# Patient Record
Sex: Female | Born: 1980 | Race: White | Hispanic: No | Marital: Married | Smoking: Never smoker
Health system: Southern US, Community
[De-identification: ages and names within clinical notes are randomized; demographics above are authoritative.]

## PROBLEM LIST (undated history)

## (undated) DIAGNOSIS — I10 Essential (primary) hypertension: Secondary | ICD-10-CM

## (undated) DIAGNOSIS — R011 Cardiac murmur, unspecified: Secondary | ICD-10-CM

---

## 2011-09-29 HISTORY — PX: CHOLECYSTECTOMY: SHX55

## 2013-04-28 DIAGNOSIS — I340 Nonrheumatic mitral (valve) insufficiency: Secondary | ICD-10-CM | POA: Insufficient documentation

## 2013-06-09 DIAGNOSIS — I10 Essential (primary) hypertension: Secondary | ICD-10-CM | POA: Insufficient documentation

## 2015-02-27 ENCOUNTER — Inpatient Hospital Stay (HOSPITAL_COMMUNITY): Admit: 2015-02-27 | Payer: Self-pay | Admitting: Obstetrics and Gynecology

## 2015-07-12 NOTE — H&P (Addendum)
Dawn Roth is a 34 y.o. female presenting for primary cesarean section due to term and breech presentation.  Pregnancy complicated by [facility] with normal anatomic and serial growth [place]. GBS-. History OB History    No data available     No past medical history on file. No past surgical history on file. Family History: family history is not on file. Social History:  has no tobacco, alcohol, and drug history on file.   Prenatal Transfer Tool  Maternal Diabetes: No Genetic Screening: Normal Maternal Ultrasounds/Referrals: Normal Fetal Ultrasounds or other Referrals:  None 2VC Maternal Substance Abuse:  No Significant Maternal Medications:  None Significant Maternal Lab Results:  None Other Comments:  None  ROS    There were no vitals taken for this visit. Exam Physical Exam   Cx Cl/75/-3 Breech Prenatal labs: ABO, Rh:   Antibody:   Rubella:   RPR:    HBsAg:    HIV:    GBS:     Assessment/Plan: IUP at term Breech presentation.  Plan LSTCS Risks and benefits of C/S were discussed.  All questions were answered and informed consent was obtained.  Plan to proceed with low segment transverse Cesarean Section.   Kimsey Demaree C 07/12/2015, 1:57 PM    This patient has been seen and examined.   All of her questions were answered.  Labs and vital signs reviewed.  Informed consent has been obtained.  The History and Physical is current. Bedside US confirms breech 07/17/15 1230 DL

## 2015-07-15 ENCOUNTER — Inpatient Hospital Stay (HOSPITAL_COMMUNITY): Payer: PRIVATE HEALTH INSURANCE

## 2015-07-15 ENCOUNTER — Encounter (HOSPITAL_COMMUNITY)
Admission: RE | Admit: 2015-07-15 | Discharge: 2015-07-15 | Disposition: A | Discharge status: Home / Self Care | Payer: PRIVATE HEALTH INSURANCE | Source: Ambulatory Visit | Attending: Obstetrics and Gynecology | Admitting: Obstetrics and Gynecology

## 2015-07-15 ENCOUNTER — Encounter (HOSPITAL_COMMUNITY): Payer: Self-pay

## 2015-07-15 HISTORY — DX: Cardiac murmur, unspecified: R01.1

## 2015-07-15 LAB — CBC
HCT: 35.6 % — ABNORMAL LOW (ref 36.0–46.0)
Hemoglobin: 11.7 g/dL — ABNORMAL LOW (ref 12.0–15.0)
MCH: 29.4 pg (ref 26.0–34.0)
MCHC: 32.9 g/dL (ref 30.0–36.0)
MCV: 89.4 fL (ref 78.0–100.0)
PLATELETS: 210 10*3/uL (ref 150–400)
RBC: 3.98 MIL/uL (ref 3.87–5.11)
RDW: 14.1 % (ref 11.5–15.5)
WBC: 9.8 10*3/uL (ref 4.0–10.5)

## 2015-07-15 LAB — TYPE AND SCREEN
ABO/RH(D): O POS
Antibody Screen: NEGATIVE

## 2015-07-15 LAB — ABO/RH: ABO/RH(D): O POS

## 2015-07-15 LAB — RPR: RPR Ser Ql: NONREACTIVE

## 2015-07-15 NOTE — Patient Instructions (Addendum)
   Your procedure is scheduled on: OCT 19 AT 1PM  Enter through the Main Entrance of [facility] at: [place] up the phone at the desk and dial [phone] and inform us of your arrival.  Please call this number if you have any problems the morning of surgery: [phone]  Remember: Do not eat food after midnight: OCT 18 (TUESDAY) Do not drink clear liquids after: 9AM DAY OF SURGERY [facility])  Take these medicines the morning of surgery with a SIP OF WATER: TAKE LABETALOL DAY OF SURGERY   Do not wear jewelry,  No metal in your hair or on your body. Do not wear lotions, powders, perfumes.  You may wear deodorant.  Do not bring valuables to the hospital.   Leave suitcase in the car. After Surgery it may be brought to your room. For patients being admitted to the hospital, checkout time is 11:00am the day of discharge.

## 2015-07-16 MED ORDER — DEXTROSE 5 % IV SOLN
2.0000 g | INTRAVENOUS | Status: DC
  Filled 2015-07-16: qty 2

## 2015-07-17 ENCOUNTER — Inpatient Hospital Stay (HOSPITAL_COMMUNITY)
Admission: AD | Admit: 2015-07-17 | Discharge: 2015-07-19 | DRG: 766 | Disposition: A | Discharge status: Home / Self Care | Payer: PRIVATE HEALTH INSURANCE | Source: Ambulatory Visit | Attending: Obstetrics and Gynecology | Admitting: Obstetrics and Gynecology

## 2015-07-17 ENCOUNTER — Encounter (HOSPITAL_COMMUNITY): Payer: Self-pay | Admitting: *Deleted

## 2015-07-17 ENCOUNTER — Inpatient Hospital Stay (HOSPITAL_COMMUNITY): Payer: PRIVATE HEALTH INSURANCE | Admitting: Anesthesiology

## 2015-07-17 ENCOUNTER — Encounter (HOSPITAL_COMMUNITY)
Admission: AD | Disposition: A | Discharge status: Home / Self Care | Payer: Self-pay | Source: Ambulatory Visit | Attending: Obstetrics and Gynecology

## 2015-07-17 DIAGNOSIS — Z3A39 39 weeks gestation of pregnancy: Secondary | ICD-10-CM | POA: Diagnosis not present

## 2015-07-17 DIAGNOSIS — O321XX0 Maternal care for breech presentation, not applicable or unspecified: Secondary | ICD-10-CM | POA: Diagnosis present

## 2015-07-17 DIAGNOSIS — Z3403 Encounter for supervision of normal first pregnancy, third trimester: Secondary | ICD-10-CM | POA: Diagnosis present

## 2015-07-17 SURGERY — Surgical Case
Anesthesia: Spinal

## 2015-07-17 MED ORDER — SENNOSIDES-DOCUSATE SODIUM 8.6-50 MG PO TABS
2.0000 | ORAL_TABLET | ORAL | Status: DC
  Administered 2015-07-18 (×2): 2 via ORAL
  Filled 2015-07-17 (×2): qty 2

## 2015-07-17 MED ORDER — KETOROLAC TROMETHAMINE 30 MG/ML IJ SOLN: 30.0000 mg | Freq: Four times a day (QID) | INTRAMUSCULAR | Status: AC | PRN

## 2015-07-17 MED ORDER — SIMETHICONE 80 MG PO CHEW
80.0000 mg | CHEWABLE_TABLET | ORAL | Status: DC | PRN
  Administered 2015-07-18: 80 mg via ORAL

## 2015-07-17 MED ORDER — OXYCODONE-ACETAMINOPHEN 5-325 MG PO TABS
2.0000 | ORAL_TABLET | ORAL | Status: DC | PRN
  Filled 2015-07-17: qty 2

## 2015-07-17 MED ORDER — ONDANSETRON HCL 4 MG/2ML IJ SOLN
INTRAMUSCULAR | Status: AC
  Filled 2015-07-17: qty 2

## 2015-07-17 MED ORDER — NALOXONE HCL 2 MG/2ML IJ SOSY: 1.0000 ug/kg/h | PREFILLED_SYRINGE | INTRAVENOUS | Status: DC | PRN

## 2015-07-17 MED ORDER — TETANUS-DIPHTH-ACELL PERTUSSIS 5-2.5-18.5 LF-MCG/0.5 IM SUSP: 0.5000 mL | Freq: Once | INTRAMUSCULAR | Status: DC

## 2015-07-17 MED ORDER — FENTANYL CITRATE (PF) 100 MCG/2ML IJ SOLN
INTRAMUSCULAR | Status: DC | PRN
  Administered 2015-07-17: 10 ug via INTRATHECAL

## 2015-07-17 MED ORDER — NALBUPHINE HCL 10 MG/ML IJ SOLN: 5.0000 mg | INTRAMUSCULAR | Status: DC | PRN

## 2015-07-17 MED ORDER — MENTHOL 3 MG MT LOZG: 1.0000 | LOZENGE | OROMUCOSAL | Status: DC | PRN

## 2015-07-17 MED ORDER — WITCH HAZEL-GLYCERIN EX PADS: 1.0000 "application " | MEDICATED_PAD | CUTANEOUS | Status: DC | PRN

## 2015-07-17 MED ORDER — PHENYLEPHRINE 8 MG IN D5W 100 ML (0.08MG/ML) PREMIX OPTIME
INJECTION | INTRAVENOUS | Status: DC | PRN
  Administered 2015-07-17: 60 ug/min via INTRAVENOUS

## 2015-07-17 MED ORDER — DIPHENHYDRAMINE HCL 50 MG/ML IJ SOLN: 12.5000 mg | INTRAMUSCULAR | Status: DC | PRN

## 2015-07-17 MED ORDER — DIPHENHYDRAMINE HCL 25 MG PO CAPS: 25.0000 mg | ORAL_CAPSULE | ORAL | Status: DC | PRN

## 2015-07-17 MED ORDER — IBUPROFEN 600 MG PO TABS
600.0000 mg | ORAL_TABLET | Freq: Four times a day (QID) | ORAL | Status: DC
  Administered 2015-07-18 – 2015-07-19 (×7): 600 mg via ORAL
  Filled 2015-07-17 (×7): qty 1

## 2015-07-17 MED ORDER — NALOXONE HCL 1 MG/ML IJ SOLN: 1.0000 ug/kg/h | INTRAMUSCULAR | Status: DC | PRN

## 2015-07-17 MED ORDER — NALOXONE HCL 0.4 MG/ML IJ SOLN: 0.4000 mg | INTRAMUSCULAR | Status: DC | PRN

## 2015-07-17 MED ORDER — KETOROLAC TROMETHAMINE 30 MG/ML IJ SOLN
30.0000 mg | Freq: Four times a day (QID) | INTRAMUSCULAR | Status: AC | PRN
  Administered 2015-07-17: 30 mg via INTRAMUSCULAR

## 2015-07-17 MED ORDER — CEFAZOLIN SODIUM-DEXTROSE 2-3 GM-% IV SOLR
INTRAVENOUS | Status: AC
  Administered 2015-07-17: 2 g via INTRAVENOUS
  Filled 2015-07-17: qty 50

## 2015-07-17 MED ORDER — OXYCODONE-ACETAMINOPHEN 5-325 MG PO TABS
1.0000 | ORAL_TABLET | ORAL | Status: DC | PRN
  Administered 2015-07-19: 1 via ORAL
  Filled 2015-07-17: qty 1

## 2015-07-17 MED ORDER — MORPHINE SULFATE (PF) 0.5 MG/ML IJ SOLN
INTRAMUSCULAR | Status: AC
  Filled 2015-07-17: qty 10

## 2015-07-17 MED ORDER — MEPERIDINE HCL 25 MG/ML IJ SOLN: 6.2500 mg | INTRAMUSCULAR | Status: DC | PRN

## 2015-07-17 MED ORDER — ACETAMINOPHEN 500 MG PO TABS
1000.0000 mg | ORAL_TABLET | Freq: Four times a day (QID) | ORAL | Status: AC
  Administered 2015-07-18: 1000 mg via ORAL
  Filled 2015-07-17: qty 2

## 2015-07-17 MED ORDER — SCOPOLAMINE 1 MG/3DAYS TD PT72
MEDICATED_PATCH | TRANSDERMAL | Status: AC
  Administered 2015-07-17: 1.5 mg via TRANSDERMAL
  Filled 2015-07-17: qty 1

## 2015-07-17 MED ORDER — LACTATED RINGERS IV SOLN
INTRAVENOUS | Status: DC
  Administered 2015-07-17: 13:00:00 via INTRAVENOUS

## 2015-07-17 MED ORDER — DIBUCAINE 1 % RE OINT: 1.0000 "application " | TOPICAL_OINTMENT | RECTAL | Status: DC | PRN

## 2015-07-17 MED ORDER — OXYTOCIN 10 UNIT/ML IJ SOLN
INTRAMUSCULAR | Status: AC
  Filled 2015-07-17: qty 4

## 2015-07-17 MED ORDER — LACTATED RINGERS IV SOLN
INTRAVENOUS | Status: DC
  Administered 2015-07-17: 125 mL/h via INTRAVENOUS

## 2015-07-17 MED ORDER — FENTANYL CITRATE (PF) 100 MCG/2ML IJ SOLN: 25.0000 ug | INTRAMUSCULAR | Status: DC | PRN

## 2015-07-17 MED ORDER — LANOLIN HYDROUS EX OINT: 1.0000 "application " | TOPICAL_OINTMENT | CUTANEOUS | Status: DC | PRN

## 2015-07-17 MED ORDER — OXYTOCIN 40 UNITS IN LACTATED RINGERS INFUSION - SIMPLE MED: 62.5000 mL/h | INTRAVENOUS | Status: AC

## 2015-07-17 MED ORDER — NALBUPHINE HCL 10 MG/ML IJ SOLN: 5.0000 mg | Freq: Once | INTRAMUSCULAR | Status: DC | PRN

## 2015-07-17 MED ORDER — OXYTOCIN 10 UNIT/ML IJ SOLN
40.0000 [IU] | INTRAVENOUS | Status: DC | PRN
  Administered 2015-07-17: 40 [IU] via INTRAVENOUS

## 2015-07-17 MED ORDER — PROMETHAZINE HCL 25 MG/ML IJ SOLN
6.2500 mg | INTRAMUSCULAR | Status: DC | PRN
Start: 2015-07-17 — End: 2015-07-17

## 2015-07-17 MED ORDER — SIMETHICONE 80 MG PO CHEW
80.0000 mg | CHEWABLE_TABLET | ORAL | Status: DC
  Administered 2015-07-18 (×2): 80 mg via ORAL
  Filled 2015-07-17 (×2): qty 1

## 2015-07-17 MED ORDER — LACTATED RINGERS IV SOLN
INTRAVENOUS | Status: DC | PRN
  Administered 2015-07-17: 14:00:00 via INTRAVENOUS

## 2015-07-17 MED ORDER — SCOPOLAMINE 1 MG/3DAYS TD PT72: 1.0000 | MEDICATED_PATCH | Freq: Once | TRANSDERMAL | Status: DC

## 2015-07-17 MED ORDER — SODIUM CHLORIDE 0.9 % IR SOLN
Status: DC | PRN
  Administered 2015-07-17: 1000 mL

## 2015-07-17 MED ORDER — KETOROLAC TROMETHAMINE 30 MG/ML IJ SOLN
INTRAMUSCULAR | Status: AC
  Administered 2015-07-17: 30 mg via INTRAMUSCULAR
  Filled 2015-07-17: qty 1

## 2015-07-17 MED ORDER — SODIUM CHLORIDE 0.9 % IJ SOLN: 3.0000 mL | INTRAMUSCULAR | Status: DC | PRN

## 2015-07-17 MED ORDER — ZOLPIDEM TARTRATE 5 MG PO TABS: 5.0000 mg | ORAL_TABLET | Freq: Every evening | ORAL | Status: DC | PRN

## 2015-07-17 MED ORDER — LACTATED RINGERS IV SOLN: INTRAVENOUS | Status: DC

## 2015-07-17 MED ORDER — ONDANSETRON HCL 4 MG/2ML IJ SOLN: 4.0000 mg | Freq: Three times a day (TID) | INTRAMUSCULAR | Status: DC | PRN

## 2015-07-17 MED ORDER — SIMETHICONE 80 MG PO CHEW
80.0000 mg | CHEWABLE_TABLET | Freq: Three times a day (TID) | ORAL | Status: DC
  Administered 2015-07-18 – 2015-07-19 (×3): 80 mg via ORAL
  Filled 2015-07-17 (×4): qty 1

## 2015-07-17 MED ORDER — MORPHINE SULFATE (PF) 0.5 MG/ML IJ SOLN
INTRAMUSCULAR | Status: DC | PRN
  Administered 2015-07-17: .2 mg via INTRATHECAL

## 2015-07-17 MED ORDER — ACETAMINOPHEN 325 MG PO TABS: 650.0000 mg | ORAL_TABLET | ORAL | Status: DC | PRN

## 2015-07-17 MED ORDER — DIPHENHYDRAMINE HCL 25 MG PO CAPS: 25.0000 mg | ORAL_CAPSULE | Freq: Four times a day (QID) | ORAL | Status: DC | PRN

## 2015-07-17 MED ORDER — LABETALOL HCL 100 MG PO TABS
100.0000 mg | ORAL_TABLET | Freq: Two times a day (BID) | ORAL | Status: DC
  Administered 2015-07-17 – 2015-07-19 (×4): 100 mg via ORAL
  Filled 2015-07-17 (×4): qty 1

## 2015-07-17 MED ORDER — PRENATAL MULTIVITAMIN CH
1.0000 | ORAL_TABLET | Freq: Every day | ORAL | Status: DC
  Administered 2015-07-18 – 2015-07-19 (×2): 1 via ORAL
  Filled 2015-07-17 (×2): qty 1

## 2015-07-17 MED ORDER — HYDROMORPHONE HCL 1 MG/ML IJ SOLN: 0.2500 mg | INTRAMUSCULAR | Status: DC | PRN

## 2015-07-17 MED ORDER — FENTANYL CITRATE (PF) 100 MCG/2ML IJ SOLN
INTRAMUSCULAR | Status: AC
  Filled 2015-07-17: qty 4

## 2015-07-17 MED ORDER — PHENYLEPHRINE 8 MG IN D5W 100 ML (0.08MG/ML) PREMIX OPTIME
INJECTION | INTRAVENOUS | Status: AC
Start: 2015-07-17 — End: 2015-07-17
  Filled 2015-07-17: qty 100

## 2015-07-17 MED ORDER — SCOPOLAMINE 1 MG/3DAYS TD PT72
1.0000 | MEDICATED_PATCH | Freq: Once | TRANSDERMAL | Status: DC
  Administered 2015-07-17: 1.5 mg via TRANSDERMAL

## 2015-07-17 MED ORDER — BUPIVACAINE IN DEXTROSE 0.75-8.25 % IT SOLN
INTRATHECAL | Status: DC | PRN
  Administered 2015-07-17: 1.6 mL via INTRATHECAL

## 2015-07-17 MED ORDER — ONDANSETRON HCL 4 MG/2ML IJ SOLN
INTRAMUSCULAR | Status: DC | PRN
  Administered 2015-07-17: 4 mg via INTRAVENOUS

## 2015-07-17 SURGICAL SUPPLY — 25 items
CLAMP CORD UMBIL (MISCELLANEOUS) IMPLANT
CLOTH BEACON ORANGE TIMEOUT ST (SAFETY) ×2 IMPLANT
DRAPE SHEET LG 3/4 BI-LAMINATE (DRAPES) IMPLANT
DRSG OPSITE POSTOP 4X10 (GAUZE/BANDAGES/DRESSINGS) ×2 IMPLANT
DURAPREP 26ML APPLICATOR (WOUND CARE) ×2 IMPLANT
ELECT REM PT RETURN 9FT ADLT (ELECTROSURGICAL) ×2
ELECTRODE REM PT RTRN 9FT ADLT (ELECTROSURGICAL) ×1 IMPLANT
EXTRACTOR VACUUM M CUP 4 TUBE (SUCTIONS) IMPLANT
GLOVE SURG ORTHO 8.0 STRL STRW (GLOVE) ×2 IMPLANT
GOWN STRL REUS W/TWL LRG LVL3 (GOWN DISPOSABLE) ×4 IMPLANT
KIT ABG SYR 3ML LUER SLIP (SYRINGE) ×2 IMPLANT
NEEDLE HYPO 25X5/8 SAFETYGLIDE (NEEDLE) ×2 IMPLANT
NS IRRIG 1000ML POUR BTL (IV SOLUTION) ×2 IMPLANT
PACK C SECTION WH (CUSTOM PROCEDURE TRAY) ×2 IMPLANT
PAD OB MATERNITY 4.3X12.25 (PERSONAL CARE ITEMS) ×2 IMPLANT
PENCIL SMOKE EVAC W/HOLSTER (ELECTROSURGICAL) ×2 IMPLANT
SUT MNCRL 0 VIOLET CTX 36 (SUTURE) ×3 IMPLANT
SUT MON AB 4-0 PS1 27 (SUTURE) ×2 IMPLANT
SUT MONOCRYL 0 CTX 36 (SUTURE) ×3
SUT PDS AB 1 CT  36 (SUTURE)
SUT PDS AB 1 CT 36 (SUTURE) IMPLANT
SUT VIC AB 1 CTX 36 (SUTURE)
SUT VIC AB 1 CTX36XBRD ANBCTRL (SUTURE) IMPLANT
TOWEL OR 17X24 6PK STRL BLUE (TOWEL DISPOSABLE) ×2 IMPLANT
TRAY FOLEY CATH SILVER 14FR (SET/KITS/TRAYS/PACK) ×2 IMPLANT

## 2015-07-17 NOTE — Op Note (Signed)
Cesarean Section Procedure Note  Pre-operative Diagnosis: IUP at 39 weeks, Breech presentation  Post-operative Diagnosis: same  Surgeon: Luz Lex   Assistants: none  Anesthesia: Spinal  Procedure:  Low Segment Transverse cesarean section  Procedure Details  The patient was seen in the Holding Room. The risks, benefits, complications, treatment options, and expected outcomes were discussed with the patient.  The patient concurred with the proposed plan, giving informed consent.  The site of surgery properly noted/marked.. A Time Out was held and the above information confirmed.  After induction of anesthesia, the patient was draped and prepped in the usual sterile manner. A Pfannenstiel incision was made and carried down through the subcutaneous tissue to the fascia. Fascial incision was made and extended transversely. The fascia was separated from the underlying rectus tissue superiorly and inferiorly. The peritoneum was identified and entered. Peritoneal incision was extended longitudinally. The utero-vesical peritoneal reflection was incised transversely and the bladder flap was bluntly freed from the lower uterine segment. A low transverse uterine incision was made. Delivered from frank breech presentation was a baby with Apgar scores of 8 at one minute and 8 at five minutes. After the umbilical cord was clamped and cut cord blood was obtained for evaluation. The placenta was removed intact and appeared normal. The uterine outline, tubes and ovaries appeared normal. The uterine incision was closed with running locked sutures of 0 monocryl and imbricated with 0 monocryl. Hemostasis was observed. Lavage was carried out until clear. The peritoneum was then closed with 0 monocryl and rectus muscles plicated in the midline.  After hemostasis was assured, the fascia was then reapproximated with running sutures of 0 Vicryl. Irrigation was applied and after adequate hemostasis was assured, the skin  was reapproximated with subcutaneous sutures using 4-0 monocryl.  Instrument, sponge, and needle counts were correct prior the abdominal closure and at the conclusion of the case. The patient received 2 grams cefotetan preoperatively.  Findings: Viable female  Estimated Blood Loss:  600cc         Specimens: Placenta was sent to pathology         Complications:  None

## 2015-07-17 NOTE — Anesthesia Preprocedure Evaluation (Addendum)
Anesthesia Evaluation  Patient identified by MRN, date of birth, ID band Patient awake    Reviewed: Allergy & Precautions, NPO status , Patient's Chart, lab work & pertinent test results, reviewed documented beta blocker date and time   Airway Mallampati: II  TM Distance: >3 FB Neck ROM: Full    Dental  (+) Teeth Intact   Pulmonary neg pulmonary ROS,    breath sounds clear to auscultation       Cardiovascular negative cardio ROS  + Valvular Problems/Murmurs MR  Rhythm:Regular Rate:Normal - Systolic murmurs    Neuro/Psych negative neurological ROS  negative psych ROS   GI/Hepatic negative GI ROS, Neg liver ROS,   Endo/Other  negative endocrine ROS  Renal/GU negative Renal ROS  negative genitourinary   Musculoskeletal negative musculoskeletal ROS (+)   Abdominal   Peds  Hematology negative hematology ROS (+)   Anesthesia Other Findings Switched to labatolol for MR with Preg; asymptomatic  Murmur not audible to me  Reproductive/Obstetrics (+) Pregnancy                           Lab Results  Component Value Date   WBC 9.8 07/15/2015   HGB 11.7* 07/15/2015   HCT 35.6* 07/15/2015   MCV 89.4 07/15/2015   PLT 210 07/15/2015   No results found for: INR, PROTIME   Anesthesia Physical Anesthesia Plan  ASA: II  Anesthesia Plan: Spinal   Post-op Pain Management:    Induction:   Airway Management Planned:   Additional Equipment:   Intra-op Plan:   Post-operative Plan:   Informed Consent: I have reviewed the patients History and Physical, chart, labs and discussed the procedure including the risks, benefits and alternatives for the proposed anesthesia with the patient or authorized representative who has indicated his/her understanding and acceptance.   Dental advisory given  Plan Discussed with: CRNA  Anesthesia Plan Comments:         Anesthesia Quick Evaluation

## 2015-07-17 NOTE — Transfer of Care (Signed)
Immediate Anesthesia Transfer of Care Note  Patient: Dawn Roth  Procedure(s) Performed: Procedure(s) with comments: CESAREAN SECTION (N/A) - Primary edc 07/14/15 nkda  Patient Location: PACU  Anesthesia Type:Spinal  Level of Consciousness: awake, alert  and oriented  Airway & Oxygen Therapy: Patient Spontanous Breathing  Post-op Assessment: Report given to RN and Post -op Vital signs reviewed and stable  Post vital signs: Reviewed and stable  Last Vitals:  Filed Vitals:   07/17/15 1127  BP: 146/78  Pulse: 84  Temp: 36.8 C  Resp: 20    Complications: No apparent anesthesia complications

## 2015-07-17 NOTE — Anesthesia Procedure Notes (Signed)
Spinal Patient location during procedure: OR Start time: 07/17/2015 1:40 AM End time: 07/17/2015 1:45 AM Staffing Anesthesiologist: Suella Broad D Performed by: anesthesiologist  Preanesthetic Checklist Completed: patient identified, site marked, surgical consent, pre-op evaluation, timeout performed, IV checked, risks and benefits discussed and monitors and equipment checked Spinal Block Patient position: sitting Prep: Betadine Patient monitoring: heart rate, continuous pulse ox, blood pressure and cardiac monitor Approach: midline Location: L4-5 Injection technique: single-shot Needle Needle type: Whitacre and Introducer  Needle gauge: 24 G Needle length: 9 cm Additional Notes Negative paresthesia. Negative blood return. Positive free-flowing CSF. Expiration date of kit checked and confirmed. Patient tolerated procedure well, without complications.

## 2015-07-17 NOTE — Anesthesia Postprocedure Evaluation (Signed)
  Anesthesia Post-op Note  Patient: Dawn Roth  Procedure(s) Performed: Procedure(s) with comments: CESAREAN SECTION (N/A) - Primary edc 07/14/15 nkda  Patient Location: PACU  Anesthesia Type:Spinal  Level of Consciousness: awake, alert  and oriented  Airway and Oxygen Therapy: Patient Spontanous Breathing  Post-op Pain: none  Post-op Assessment: Post-op Vital signs reviewed and Patient's Cardiovascular Status Stable              Post-op Vital Signs: Reviewed and stable  Last Vitals:  Filed Vitals:   07/17/15 1615  BP: 115/65  Pulse: 61  Temp:   Resp: 16    Complications: No apparent anesthesia complications

## 2015-07-18 ENCOUNTER — Encounter (HOSPITAL_COMMUNITY): Payer: Self-pay | Admitting: Obstetrics and Gynecology

## 2015-07-18 LAB — CBC
HCT: 29.7 % — ABNORMAL LOW (ref 36.0–46.0)
Hemoglobin: 9.8 g/dL — ABNORMAL LOW (ref 12.0–15.0)
MCH: 29.3 pg (ref 26.0–34.0)
MCHC: 33 g/dL (ref 30.0–36.0)
MCV: 88.7 fL (ref 78.0–100.0)
Platelets: 192 10*3/uL (ref 150–400)
RBC: 3.35 MIL/uL — ABNORMAL LOW (ref 3.87–5.11)
RDW: 14 % (ref 11.5–15.5)
WBC: 10.5 10*3/uL (ref 4.0–10.5)

## 2015-07-18 NOTE — Lactation Note (Signed)
This note was copied from the chart of Dawn Jennylee Uehara. Lactation Consultation Note  Patient Name: Dawn Roth [id number] Date: 07/18/2015 Reason for consult: Follow-up assessment Baby at 69 hr old and was just circamcision today. Mom reports baby was latching much better before the circamcision. Since he has come back he has been sleepy, he is on and off the breast a lot more now. She reports no nipple pain or soreness. She is aware of lactation services and will call as needed.    Maternal Data    Feeding Feeding Type: Breast Fed  LATCH Score/Interventions Latch: Grasps breast easily, tongue down, lips flanged, rhythmical sucking.  Audible Swallowing: A few with stimulation  Type of Nipple: Everted at rest and after stimulation  Comfort (Breast/Nipple): Soft / non-tender     Hold (Positioning): No assistance needed to correctly position infant at breast.  LATCH Score: 9  Lactation Tools Discussed/Used     Consult Status Consult Status: Follow-up Date: 07/19/15 Follow-up type: In-patient    Denzil Hughes 07/18/2015, 5:20 PM

## 2015-07-18 NOTE — Progress Notes (Signed)
Subjective: Postpartum Day 1: Cesarean Delivery Patient reports tolerating PO.    Objective: Vital signs in last 24 hours: Temp:  [97.8 F (36.6 C)-98.8 F (37.1 C)] 97.8 F (36.6 C) (10/20 0551) Pulse Rate:  [61-99] 70 (10/20 0551) Resp:  [16-20] 18 (10/20 0551) BP: (113-146)/(40-78) 113/48 mmHg (10/20 0551) SpO2:  [95 %-100 %] 97 % (10/20 0551) Weight:  [193 lb (87.544 kg)] 193 lb (87.544 kg) (10/19 1232)  Physical Exam:  General: alert and cooperative Lochia: appropriate Uterine Fundus: firm Incision: honeycomb dressing noted with old drainage noted on bandage DVT Evaluation: No evidence of DVT seen on physical exam. Negative Homan's sign. No cords or calf tenderness. No significant calf/ankle edema.   Recent Labs  07/15/15 0830 07/18/15 0530  HGB 11.7* 9.8*  HCT 35.6* 29.7*    Assessment/Plan: Status post Cesarean section. Doing well postoperatively.  Continue current care.  Dawn Roth G 07/18/2015, 7:59 AM

## 2015-07-18 NOTE — Addendum Note (Signed)
Addendum  created 07/18/15 1012 by Flossie Dibble, CRNA   Modules edited: Notes Section   Notes Section:  File: [id number]

## 2015-07-18 NOTE — Anesthesia Postprocedure Evaluation (Signed)
Anesthesia Post Note  Patient: Dawn Roth  Procedure(s) Performed: Procedure(s) (LRB): CESAREAN SECTION (N/A)  Anesthesia type: Spinal  Patient location: Mother/Baby  Post pain: Pain level controlled  Post assessment: Post-op Vital signs reviewed  Last Vitals:  Filed Vitals:   07/18/15 0945  BP: 128/49  Pulse: 84  Temp: 37 C  Resp: 18    Post vital signs: Reviewed  Level of consciousness: awake  Complications: No apparent anesthesia complications

## 2015-07-19 MED ORDER — OXYCODONE-ACETAMINOPHEN 5-325 MG PO TABS: 1.0000 | ORAL_TABLET | ORAL | Status: DC | PRN

## 2015-07-19 MED ORDER — IBUPROFEN 600 MG PO TABS: 600.0000 mg | ORAL_TABLET | Freq: Four times a day (QID) | ORAL | Status: DC

## 2015-07-19 NOTE — Discharge Summary (Signed)
Obstetric Discharge Summary Reason for Admission: cesarean section Prenatal Procedures: ultrasound Intrapartum Procedures: cesarean: low cervical, transverse Postpartum Procedures: none Complications-Operative and Postpartum: none HEMOGLOBIN  Date Value Ref Range Status  07/18/2015 9.8* 12.0 - 15.0 g/dL Final   HCT  Date Value Ref Range Status  07/18/2015 29.7* 36.0 - 46.0 % Final    Physical Exam:  General: alert and cooperative Lochia: appropriate Uterine Fundus: firm Incision: healing well DVT Evaluation: No evidence of DVT seen on physical exam. Negative Homan's sign. No cords or calf tenderness. No significant calf/ankle edema.  Discharge Diagnoses: Term Pregnancy-delivered  Discharge Information: Date: 07/19/2015 Activity: pelvic rest Diet: routine Medications: PNV, Ibuprofen, Percocet and labetalol Condition: stable Instructions: refer to practice specific booklet Discharge to: home   Newborn Data: Live born female  Birth Weight: 7 lb 5.8 oz (3340 g) APGAR: 8, 9  Home with mother.  Freeman Borba G 07/19/2015, 8:06 AM

## 2015-11-15 DIAGNOSIS — Z3041 Encounter for surveillance of contraceptive pills: Secondary | ICD-10-CM | POA: Insufficient documentation

## 2016-05-13 DIAGNOSIS — D239 Other benign neoplasm of skin, unspecified: Secondary | ICD-10-CM | POA: Insufficient documentation

## 2016-12-31 DIAGNOSIS — I10 Essential (primary) hypertension: Secondary | ICD-10-CM | POA: Diagnosis not present

## 2016-12-31 DIAGNOSIS — Z0000 Encounter for general adult medical examination without abnormal findings: Secondary | ICD-10-CM | POA: Diagnosis not present

## 2016-12-31 DIAGNOSIS — H9313 Tinnitus, bilateral: Secondary | ICD-10-CM | POA: Diagnosis not present

## 2017-01-11 DIAGNOSIS — R002 Palpitations: Secondary | ICD-10-CM | POA: Diagnosis not present

## 2017-01-11 DIAGNOSIS — I493 Ventricular premature depolarization: Secondary | ICD-10-CM | POA: Diagnosis not present

## 2017-01-11 DIAGNOSIS — I340 Nonrheumatic mitral (valve) insufficiency: Secondary | ICD-10-CM | POA: Diagnosis not present

## 2017-01-11 DIAGNOSIS — I10 Essential (primary) hypertension: Secondary | ICD-10-CM | POA: Diagnosis not present

## 2017-04-15 DIAGNOSIS — N911 Secondary amenorrhea: Secondary | ICD-10-CM | POA: Diagnosis not present

## 2017-04-23 LAB — OB RESULTS CONSOLE GC/CHLAMYDIA
Chlamydia: NEGATIVE
GC PROBE AMP, GENITAL: NEGATIVE

## 2017-04-23 LAB — OB RESULTS CONSOLE ABO/RH: RH TYPE: POSITIVE

## 2017-04-23 LAB — OB RESULTS CONSOLE HIV ANTIBODY (ROUTINE TESTING): HIV: NONREACTIVE

## 2017-04-23 LAB — OB RESULTS CONSOLE RUBELLA ANTIBODY, IGM: Rubella: IMMUNE

## 2017-04-23 LAB — OB RESULTS CONSOLE ANTIBODY SCREEN: Antibody Screen: NEGATIVE

## 2017-04-23 LAB — OB RESULTS CONSOLE RPR: RPR: NONREACTIVE

## 2017-04-23 LAB — OB RESULTS CONSOLE HEPATITIS B SURFACE ANTIGEN: Hepatitis B Surface Ag: NEGATIVE

## 2017-07-29 DIAGNOSIS — Z23 Encounter for immunization: Secondary | ICD-10-CM | POA: Diagnosis not present

## 2017-08-26 DIAGNOSIS — Z23 Encounter for immunization: Secondary | ICD-10-CM | POA: Diagnosis not present

## 2017-09-13 DIAGNOSIS — J209 Acute bronchitis, unspecified: Secondary | ICD-10-CM | POA: Diagnosis not present

## 2017-11-02 NOTE — H&P (Signed)
Dawn Roth is a 37 y.o. female presenting for repeat c-section and sterilization.  Pregnancy uncomplicated.  She has a h/o MVR - takes labetalol.  H/o PIH - ASA qd OB History    Gravida Para Term Preterm AB Living   1 1 1     1    SAB TAB Ectopic Multiple Live Births         0 1     Past Medical History:  Diagnosis Date  . Heart murmur    MITRAL VALUVE REGURITATION   Past Surgical History:  Procedure Laterality Date  . CESAREAN SECTION N/A 07/17/2015   Procedure: CESAREAN SECTION;  Surgeon: Louretta Shorten, MD;  Location: [facility];  Service: Obstetrics;  Laterality: N/A;  Primary edc 07/14/15 nkda  . CHOLECYSTECTOMY     Family History: family history is not on file. Social History:  reports that  has never smoked. she has never used smokeless tobacco. She reports that she does not drink alcohol or use drugs.   Meds:  ASA, labetalol 100mg , PNV     Maternal Diabetes: No Genetic Screening: Normal Maternal Ultrasounds/Referrals: Normal Fetal Ultrasounds or other Referrals:  None Maternal Substance Abuse:  No Significant Maternal Medications:  None Significant Maternal Lab Results:  None Other Comments:  None  ROS History   Exam  AF, VSS Physical Exam  Gen - NAD Abd - gravid, NT Ext - NT Prenatal labs: ABO, Rh:   Antibody:   Rubella:   RPR:    HBsAg:    HIV:    GBS:     Assessment/Plan: Previous c-sction, for repeat with sterilization   Dawn Roth 11/02/2017, 8:36 AM

## 2017-11-09 ENCOUNTER — Encounter (HOSPITAL_COMMUNITY): Payer: Self-pay | Admitting: *Deleted

## 2017-11-09 ENCOUNTER — Telehealth (HOSPITAL_COMMUNITY): Payer: Self-pay | Admitting: *Deleted

## 2017-11-09 NOTE — Telephone Encounter (Signed)
Preadmission screen  

## 2017-11-10 ENCOUNTER — Telehealth (HOSPITAL_COMMUNITY): Payer: Self-pay | Admitting: *Deleted

## 2017-11-10 NOTE — Telephone Encounter (Signed)
Preadmission screen  

## 2017-11-11 ENCOUNTER — Encounter (HOSPITAL_COMMUNITY): Payer: Self-pay

## 2017-11-11 ENCOUNTER — Telehealth (HOSPITAL_COMMUNITY): Payer: Self-pay | Admitting: *Deleted

## 2017-11-11 NOTE — Telephone Encounter (Signed)
Preadmission screen  

## 2017-11-17 ENCOUNTER — Encounter (HOSPITAL_COMMUNITY): Payer: Self-pay

## 2017-11-17 ENCOUNTER — Encounter (HOSPITAL_COMMUNITY)
Admission: AD | Disposition: A | Discharge status: Home / Self Care | Payer: Self-pay | Source: Ambulatory Visit | Attending: Obstetrics and Gynecology

## 2017-11-17 ENCOUNTER — Inpatient Hospital Stay (HOSPITAL_COMMUNITY)
Admission: AD | Admit: 2017-11-17 | Discharge: 2017-11-19 | DRG: 784 | Disposition: A | Discharge status: Home / Self Care | Payer: 59 | Source: Ambulatory Visit | Attending: Obstetrics and Gynecology | Admitting: Obstetrics and Gynecology

## 2017-11-17 ENCOUNTER — Inpatient Hospital Stay (HOSPITAL_COMMUNITY): Payer: 59 | Admitting: Anesthesiology

## 2017-11-17 DIAGNOSIS — Z302 Encounter for sterilization: Secondary | ICD-10-CM | POA: Diagnosis not present

## 2017-11-17 DIAGNOSIS — O4292 Full-term premature rupture of membranes, unspecified as to length of time between rupture and onset of labor: Principal | ICD-10-CM | POA: Diagnosis present

## 2017-11-17 DIAGNOSIS — O321XX0 Maternal care for breech presentation, not applicable or unspecified: Secondary | ICD-10-CM | POA: Diagnosis present

## 2017-11-17 DIAGNOSIS — D649 Anemia, unspecified: Secondary | ICD-10-CM | POA: Diagnosis present

## 2017-11-17 DIAGNOSIS — I340 Nonrheumatic mitral (valve) insufficiency: Secondary | ICD-10-CM | POA: Diagnosis present

## 2017-11-17 DIAGNOSIS — O9902 Anemia complicating childbirth: Secondary | ICD-10-CM | POA: Diagnosis present

## 2017-11-17 DIAGNOSIS — O34211 Maternal care for low transverse scar from previous cesarean delivery: Secondary | ICD-10-CM | POA: Diagnosis present

## 2017-11-17 DIAGNOSIS — Z3A38 38 weeks gestation of pregnancy: Secondary | ICD-10-CM

## 2017-11-17 DIAGNOSIS — O1002 Pre-existing essential hypertension complicating childbirth: Secondary | ICD-10-CM | POA: Diagnosis present

## 2017-11-17 DIAGNOSIS — Z98891 History of uterine scar from previous surgery: Secondary | ICD-10-CM

## 2017-11-17 HISTORY — DX: Essential (primary) hypertension: I10

## 2017-11-17 LAB — URINALYSIS, ROUTINE W REFLEX MICROSCOPIC
BILIRUBIN URINE: NEGATIVE
Glucose, UA: NEGATIVE mg/dL
HGB URINE DIPSTICK: NEGATIVE
Ketones, ur: NEGATIVE mg/dL
Leukocytes, UA: NEGATIVE
Nitrite: NEGATIVE
PH: 5 (ref 5.0–8.0)
Protein, ur: NEGATIVE mg/dL
SPECIFIC GRAVITY, URINE: 1.006 (ref 1.005–1.030)

## 2017-11-17 LAB — CBC
HCT: 33.9 % — ABNORMAL LOW (ref 36.0–46.0)
Hemoglobin: 11.3 g/dL — ABNORMAL LOW (ref 12.0–15.0)
MCH: 29.7 pg (ref 26.0–34.0)
MCHC: 33.3 g/dL (ref 30.0–36.0)
MCV: 89.2 fL (ref 78.0–100.0)
PLATELETS: 230 10*3/uL (ref 150–400)
RBC: 3.8 MIL/uL — AB (ref 3.87–5.11)
RDW: 14.2 % (ref 11.5–15.5)
WBC: 10.2 10*3/uL (ref 4.0–10.5)

## 2017-11-17 LAB — TYPE AND SCREEN
ABO/RH(D): O POS
ANTIBODY SCREEN: NEGATIVE

## 2017-11-17 LAB — POCT FERN TEST: POCT Fern Test: POSITIVE

## 2017-11-17 LAB — RPR: RPR Ser Ql: NONREACTIVE

## 2017-11-17 SURGERY — Surgical Case
Anesthesia: Spinal | Laterality: Bilateral

## 2017-11-17 MED ORDER — MEASLES, MUMPS & RUBELLA VAC ~~LOC~~ INJ
0.5000 mL | INJECTION | Freq: Once | SUBCUTANEOUS | Status: DC
  Filled 2017-11-17: qty 0.5

## 2017-11-17 MED ORDER — OXYCODONE HCL 5 MG PO TABS: 5.0000 mg | ORAL_TABLET | Freq: Once | ORAL | Status: DC | PRN

## 2017-11-17 MED ORDER — SENNOSIDES-DOCUSATE SODIUM 8.6-50 MG PO TABS
2.0000 | ORAL_TABLET | ORAL | Status: DC
  Administered 2017-11-18: 2 via ORAL
  Filled 2017-11-17 (×2): qty 2

## 2017-11-17 MED ORDER — CEFAZOLIN SODIUM-DEXTROSE 2-4 GM/100ML-% IV SOLN: 2.0000 g | INTRAVENOUS | Status: DC

## 2017-11-17 MED ORDER — NALBUPHINE HCL 10 MG/ML IJ SOLN: 5.0000 mg | Freq: Once | INTRAMUSCULAR | Status: DC | PRN

## 2017-11-17 MED ORDER — DIBUCAINE 1 % RE OINT: 1.0000 "application " | TOPICAL_OINTMENT | RECTAL | Status: DC | PRN

## 2017-11-17 MED ORDER — WITCH HAZEL-GLYCERIN EX PADS: 1.0000 "application " | MEDICATED_PAD | CUTANEOUS | Status: DC | PRN

## 2017-11-17 MED ORDER — PROMETHAZINE HCL 25 MG/ML IJ SOLN: 6.2500 mg | INTRAMUSCULAR | Status: DC | PRN

## 2017-11-17 MED ORDER — DEXTROSE IN LACTATED RINGERS 5 % IV SOLN
INTRAVENOUS | Status: DC
  Administered 2017-11-17: 18:00:00 via INTRAVENOUS

## 2017-11-17 MED ORDER — SCOPOLAMINE 1 MG/3DAYS TD PT72
MEDICATED_PATCH | TRANSDERMAL | Status: AC
  Filled 2017-11-17: qty 1

## 2017-11-17 MED ORDER — OXYTOCIN 10 UNIT/ML IJ SOLN
INTRAMUSCULAR | Status: AC
  Filled 2017-11-17: qty 4

## 2017-11-17 MED ORDER — LACTATED RINGERS IV SOLN
INTRAVENOUS | Status: DC | PRN
  Administered 2017-11-17: 09:00:00 via INTRAVENOUS

## 2017-11-17 MED ORDER — IBUPROFEN 600 MG PO TABS
600.0000 mg | ORAL_TABLET | Freq: Four times a day (QID) | ORAL | Status: DC
  Administered 2017-11-17 – 2017-11-19 (×7): 600 mg via ORAL
  Filled 2017-11-17 (×7): qty 1

## 2017-11-17 MED ORDER — FENTANYL CITRATE (PF) 100 MCG/2ML IJ SOLN
INTRAMUSCULAR | Status: DC | PRN
  Administered 2017-11-17: 10 ug via INTRATHECAL

## 2017-11-17 MED ORDER — OXYTOCIN 40 UNITS IN LACTATED RINGERS INFUSION - SIMPLE MED: 2.5000 [IU]/h | INTRAVENOUS | Status: AC

## 2017-11-17 MED ORDER — HYDROMORPHONE HCL 1 MG/ML IJ SOLN: 0.2500 mg | INTRAMUSCULAR | Status: DC | PRN

## 2017-11-17 MED ORDER — NALBUPHINE HCL 10 MG/ML IJ SOLN: 5.0000 mg | INTRAMUSCULAR | Status: DC | PRN

## 2017-11-17 MED ORDER — DIPHENHYDRAMINE HCL 50 MG/ML IJ SOLN: 12.5000 mg | INTRAMUSCULAR | Status: DC | PRN

## 2017-11-17 MED ORDER — ONDANSETRON HCL 4 MG/2ML IJ SOLN
INTRAMUSCULAR | Status: DC | PRN
  Administered 2017-11-17: 4 mg via INTRAVENOUS

## 2017-11-17 MED ORDER — PHENYLEPHRINE HCL 10 MG/ML IJ SOLN: INTRAMUSCULAR | Status: DC | PRN

## 2017-11-17 MED ORDER — OXYCODONE-ACETAMINOPHEN 5-325 MG PO TABS
1.0000 | ORAL_TABLET | ORAL | Status: DC | PRN
  Administered 2017-11-18 – 2017-11-19 (×4): 1 via ORAL
  Filled 2017-11-17 (×5): qty 1

## 2017-11-17 MED ORDER — CEFAZOLIN SODIUM-DEXTROSE 2-3 GM-%(50ML) IV SOLR
INTRAVENOUS | Status: DC | PRN
  Administered 2017-11-17: 2 g via INTRAVENOUS

## 2017-11-17 MED ORDER — OXYTOCIN 10 UNIT/ML IJ SOLN
INTRAVENOUS | Status: DC | PRN
  Administered 2017-11-17: 40 [IU] via INTRAVENOUS

## 2017-11-17 MED ORDER — SIMETHICONE 80 MG PO CHEW
80.0000 mg | CHEWABLE_TABLET | Freq: Three times a day (TID) | ORAL | Status: DC
  Administered 2017-11-17 – 2017-11-19 (×5): 80 mg via ORAL
  Filled 2017-11-17 (×5): qty 1

## 2017-11-17 MED ORDER — DIPHENHYDRAMINE HCL 25 MG PO CAPS
25.0000 mg | ORAL_CAPSULE | Freq: Four times a day (QID) | ORAL | Status: DC | PRN
Start: 2017-11-17 — End: 2017-11-19

## 2017-11-17 MED ORDER — MEPERIDINE HCL 25 MG/ML IJ SOLN: 6.2500 mg | INTRAMUSCULAR | Status: DC | PRN

## 2017-11-17 MED ORDER — MEDROXYPROGESTERONE ACETATE 150 MG/ML IM SUSP: 150.0000 mg | INTRAMUSCULAR | Status: DC | PRN

## 2017-11-17 MED ORDER — OXYCODONE-ACETAMINOPHEN 5-325 MG PO TABS: 2.0000 | ORAL_TABLET | ORAL | Status: DC | PRN

## 2017-11-17 MED ORDER — SIMETHICONE 80 MG PO CHEW: 80.0000 mg | CHEWABLE_TABLET | ORAL | Status: DC | PRN

## 2017-11-17 MED ORDER — TETANUS-DIPHTH-ACELL PERTUSSIS 5-2.5-18.5 LF-MCG/0.5 IM SUSP: 0.5000 mL | Freq: Once | INTRAMUSCULAR | Status: DC

## 2017-11-17 MED ORDER — BUPIVACAINE IN DEXTROSE 0.75-8.25 % IT SOLN
INTRATHECAL | Status: DC | PRN
  Administered 2017-11-17: 10.5 mg via INTRATHECAL

## 2017-11-17 MED ORDER — LACTATED RINGERS IV SOLN
INTRAVENOUS | Status: DC
  Administered 2017-11-17 (×2): via INTRAVENOUS

## 2017-11-17 MED ORDER — SOD CITRATE-CITRIC ACID 500-334 MG/5ML PO SOLN
30.0000 mL | Freq: Once | ORAL | Status: AC
  Administered 2017-11-17: 30 mL via ORAL
  Filled 2017-11-17: qty 15

## 2017-11-17 MED ORDER — FENTANYL CITRATE (PF) 100 MCG/2ML IJ SOLN
INTRAMUSCULAR | Status: AC
  Filled 2017-11-17: qty 2

## 2017-11-17 MED ORDER — SCOPOLAMINE 1 MG/3DAYS TD PT72
1.0000 | MEDICATED_PATCH | Freq: Once | TRANSDERMAL | Status: DC
  Administered 2017-11-17: 1.5 mg via TRANSDERMAL

## 2017-11-17 MED ORDER — PHENYLEPHRINE 8 MG IN D5W 100 ML (0.08MG/ML) PREMIX OPTIME
INJECTION | INTRAVENOUS | Status: AC
Start: 2017-11-17 — End: ?
  Filled 2017-11-17: qty 100

## 2017-11-17 MED ORDER — NALOXONE HCL 4 MG/10ML IJ SOLN: 1.0000 ug/kg/h | INTRAVENOUS | Status: DC | PRN

## 2017-11-17 MED ORDER — SODIUM CHLORIDE 0.9% FLUSH: 3.0000 mL | INTRAVENOUS | Status: DC | PRN

## 2017-11-17 MED ORDER — MORPHINE SULFATE (PF) 0.5 MG/ML IJ SOLN
INTRAMUSCULAR | Status: DC | PRN
  Administered 2017-11-17: .2 mg via INTRATHECAL

## 2017-11-17 MED ORDER — PRENATAL MULTIVITAMIN CH
1.0000 | ORAL_TABLET | Freq: Every day | ORAL | Status: DC
  Administered 2017-11-18: 1 via ORAL
  Filled 2017-11-17: qty 1

## 2017-11-17 MED ORDER — KETOROLAC TROMETHAMINE 30 MG/ML IJ SOLN
30.0000 mg | Freq: Once | INTRAMUSCULAR | Status: DC | PRN
  Administered 2017-11-17: 30 mg via INTRAVENOUS

## 2017-11-17 MED ORDER — PHENYLEPHRINE HCL 10 MG/ML IJ SOLN
INTRAMUSCULAR | Status: DC | PRN
  Administered 2017-11-17: 60 ug/min via INTRAVENOUS

## 2017-11-17 MED ORDER — ONDANSETRON HCL 4 MG/2ML IJ SOLN
INTRAMUSCULAR | Status: AC
  Filled 2017-11-17: qty 2

## 2017-11-17 MED ORDER — NALOXONE HCL 0.4 MG/ML IJ SOLN: 0.4000 mg | INTRAMUSCULAR | Status: DC | PRN

## 2017-11-17 MED ORDER — DIPHENHYDRAMINE HCL 25 MG PO CAPS: 25.0000 mg | ORAL_CAPSULE | ORAL | Status: DC | PRN

## 2017-11-17 MED ORDER — MORPHINE SULFATE (PF) 0.5 MG/ML IJ SOLN
INTRAMUSCULAR | Status: AC
  Filled 2017-11-17: qty 10

## 2017-11-17 MED ORDER — ONDANSETRON HCL 4 MG/2ML IJ SOLN: 4.0000 mg | Freq: Three times a day (TID) | INTRAMUSCULAR | Status: DC | PRN

## 2017-11-17 MED ORDER — ACETAMINOPHEN 325 MG PO TABS: 650.0000 mg | ORAL_TABLET | ORAL | Status: DC | PRN

## 2017-11-17 MED ORDER — SIMETHICONE 80 MG PO CHEW
80.0000 mg | CHEWABLE_TABLET | ORAL | Status: DC
  Administered 2017-11-18 – 2017-11-19 (×2): 80 mg via ORAL
  Filled 2017-11-17 (×2): qty 1

## 2017-11-17 MED ORDER — KETOROLAC TROMETHAMINE 30 MG/ML IJ SOLN
INTRAMUSCULAR | Status: AC
  Filled 2017-11-17: qty 1

## 2017-11-17 MED ORDER — MENTHOL 3 MG MT LOZG: 1.0000 | LOZENGE | OROMUCOSAL | Status: DC | PRN

## 2017-11-17 MED ORDER — COCONUT OIL OIL: 1.0000 "application " | TOPICAL_OIL | Status: DC | PRN

## 2017-11-17 MED ORDER — OXYCODONE HCL 5 MG/5ML PO SOLN: 5.0000 mg | Freq: Once | ORAL | Status: DC | PRN

## 2017-11-17 MED ORDER — FAMOTIDINE IN NACL 20-0.9 MG/50ML-% IV SOLN
20.0000 mg | Freq: Once | INTRAVENOUS | Status: AC
  Administered 2017-11-17: 20 mg via INTRAVENOUS
  Filled 2017-11-17: qty 50

## 2017-11-17 SURGICAL SUPPLY — 27 items
CHLORAPREP W/TINT 26ML (MISCELLANEOUS) ×2 IMPLANT
CLAMP CORD UMBIL (MISCELLANEOUS) IMPLANT
CLOTH BEACON ORANGE TIMEOUT ST (SAFETY) ×2 IMPLANT
DERMABOND ADVANCED (GAUZE/BANDAGES/DRESSINGS) ×1
DERMABOND ADVANCED .7 DNX12 (GAUZE/BANDAGES/DRESSINGS) ×1 IMPLANT
DRSG OPSITE POSTOP 4X10 (GAUZE/BANDAGES/DRESSINGS) ×2 IMPLANT
ELECT REM PT RETURN 9FT ADLT (ELECTROSURGICAL) ×2
ELECTRODE REM PT RTRN 9FT ADLT (ELECTROSURGICAL) ×1 IMPLANT
EXTRACTOR VACUUM M CUP 4 TUBE (SUCTIONS) IMPLANT
GLOVE BIO SURGEON STRL SZ 6.5 (GLOVE) ×2 IMPLANT
GLOVE BIOGEL PI IND STRL 7.0 (GLOVE) ×2 IMPLANT
GLOVE BIOGEL PI INDICATOR 7.0 (GLOVE) ×2
GOWN STRL REUS W/TWL LRG LVL3 (GOWN DISPOSABLE) ×4 IMPLANT
KIT ABG SYR 3ML LUER SLIP (SYRINGE) IMPLANT
NEEDLE HYPO 25X5/8 SAFETYGLIDE (NEEDLE) IMPLANT
NS IRRIG 1000ML POUR BTL (IV SOLUTION) ×2 IMPLANT
PACK C SECTION WH (CUSTOM PROCEDURE TRAY) ×2 IMPLANT
PAD OB MATERNITY 4.3X12.25 (PERSONAL CARE ITEMS) ×2 IMPLANT
PENCIL SMOKE EVAC W/HOLSTER (ELECTROSURGICAL) ×2 IMPLANT
SUT CHROMIC 0 CT 802H (SUTURE) IMPLANT
SUT CHROMIC 0 CTX 36 (SUTURE) ×6 IMPLANT
SUT MON AB-0 CT1 36 (SUTURE) ×2 IMPLANT
SUT PDS AB 0 CTX 60 (SUTURE) ×2 IMPLANT
SUT PLAIN 0 NONE (SUTURE) IMPLANT
SUT VIC AB 4-0 KS 27 (SUTURE) IMPLANT
TOWEL OR 17X24 6PK STRL BLUE (TOWEL DISPOSABLE) ×2 IMPLANT
TRAY FOLEY BAG SILVER LF 14FR (SET/KITS/TRAYS/PACK) IMPLANT

## 2017-11-17 NOTE — Plan of Care (Signed)
  Activity: Risk for activity intolerance will decrease 11/17/2017 1607 by Tollie Eth, RN Note Assisted patient to bathroom for the first time for peri care. Patient did well. Discussed the importance of increased ambulation as tolerated.    Education: Knowledge of General Education information will improve 11/17/2017 1607 by Tollie Eth, RN Note Admission education, safety and unit protocols reviewed with patient.

## 2017-11-17 NOTE — MAU Note (Signed)
Anesthesiologist notified of case and when she last ate/drank. Recommended waiting 8 hours to do the surgery. Dr. Corinna Capra notified of his recommendation; orders to prep for surgery, but will plan to wait until after 0800 to do the C/S and it will be Dr. Julien Girt on call at that time. OR care coordinator and nursing house supervisor notified of plan of care.

## 2017-11-17 NOTE — Anesthesia Procedure Notes (Signed)
Spinal  Patient location during procedure: OR Start time: 11/17/2017 8:35 AM End time: 11/17/2017 8:40 AM Staffing Anesthesiologist: Murvin Natal, MD Performed: anesthesiologist  Preanesthetic Checklist Completed: patient identified, surgical consent, pre-op evaluation, timeout performed, IV checked, risks and benefits discussed and monitors and equipment checked Spinal Block Patient position: sitting Prep: DuraPrep Patient monitoring: cardiac monitor, continuous pulse ox and blood pressure Approach: midline Location: L4-5 Injection technique: single-shot Needle Needle type: Pencan  Needle gauge: 24 G Needle length: 9 cm Assessment Sensory level: T10 Additional Notes Functioning IV was confirmed and monitors were applied. Sterile prep and drape, including hand hygiene and sterile gloves were used. The patient was positioned and the spine was prepped. The skin was anesthetized with lidocaine.  Free flow of clear CSF was obtained prior to injecting local anesthetic into the CSF.  The spinal needle aspirated freely following injection.  The needle was carefully withdrawn.  The patient tolerated the procedure well.

## 2017-11-17 NOTE — Progress Notes (Signed)
Dr. Julien Girt states she plans to not start blood pressure medication for now and monitor blood pressure for about 24 hours since blood pressure is stable. To call MD if blood pressure increases. Will continue to monitor. Dawn Roth, Leretha Dykes [place]

## 2017-11-17 NOTE — Progress Notes (Signed)
Patient states she took blood pressure medication prior to pregnancy and continued it twice a day during pregnancy. Blood pressure medication not in orders. Called Dr. Julien Girt and left message on voicemail about previous medication. Patient would like to continue. Awaiting an order or call back. Maxwell Caul, Leretha Dykes [place]

## 2017-11-17 NOTE — Transfer of Care (Signed)
Immediate Anesthesia Transfer of Care Note  Patient: Dawn Roth  Procedure(s) Performed: CESAREAN SECTION WITH BILATERAL TUBAL LIGATION (Bilateral )  Patient Location: PACU  Anesthesia Type:Spinal  Level of Consciousness: awake, alert  and oriented  Airway & Oxygen Therapy: Patient Spontanous Breathing  Post-op Assessment: Report given to RN and Post -op Vital signs reviewed and stable  Post vital signs: Reviewed and stable  Last Vitals:  Vitals:   11/17/17 0527 11/17/17 0939  BP: 140/84   Pulse: 83 82  Resp: 18 16  Temp: 36.7 C (!) 36.4 C  SpO2: 98% 98%    Last Pain:  Vitals:   11/17/17 0939  TempSrc: Oral         Complications: No apparent anesthesia complications

## 2017-11-17 NOTE — Op Note (Signed)
Cesarean Section Procedure Note   Dawn Roth  11/17/2017  Indications: Breech Presentation and SROM   Pre-operative Diagnosis: previous X 1, desires sterility.   Post-operative Diagnosis: Same   Procedure:  Repeat c-section and bilateral partial salpingectomy  Surgeon: Surgeon(s) and Role:    Marylynn Pearson, MD - Primary   Assistants: none  Anesthesia: spinal   Procedure Details:  The patient was seen in the Holding Room. The risks, benefits, complications, treatment options, and expected outcomes were discussed with the patient. The patient concurred with the proposed plan, giving informed consent. identified as [name] and the procedure verified as C-Section Delivery. A Time Out was held and the above information confirmed.  After induction of anesthesia, the patient was draped and prepped in the usual sterile manner. A transverse was made and carried down through the subcutaneous tissue to the fascia. Fascial incision was made and extended transversely. The fascia was separated from the underlying rectus tissue superiorly and inferiorly. The peritoneum was identified and entered. Peritoneal incision was extended longitudinally. The utero-vesical peritoneal reflection was incised transversely and the bladder flap was bluntly freed from the lower uterine segment. A low transverse uterine incision was made. Delivered from breech presentation was a viable female infant. Cord ph was not sent the umbilical cord was clamped and cut cord blood was obtained for evaluation. The placenta was removed Intact and appeared normal. The uterine outline, tubes and ovaries appeared normal}. The uterine incision was closed with running locked sutures of 0chromic gut.   Hemostasis was observed. Lavage was carried out until clear.  Right fallopian tube was grasped with a babcock and doubly tied with plain gut suture.  Knuckle of tube was excised and hemostasis was noted.  Procedure was repeated on  opposite tube.   The fascia was then reapproximated with running sutures of 0chromic gut  The skin was closed with 4-0Vicryl.   Instrument, sponge, and needle counts were correct prior the abdominal closure and were correct at the conclusion of the case.     Estimated Blood Loss: 605 mL   Urine Output: clear  Specimens: segments of bilateral fallopian tubes  Complications: no complications  Disposition: PACU - hemodynamically stable.   Maternal Condition: stable   Baby condition / location:  Couplet care / Skin to Skin  Attending Attestation: I was present and scrubbed for the entire procedure.   Signed: Surgeon(s): Marylynn Pearson, MD

## 2017-11-17 NOTE — MAU Note (Signed)
Pt here with c/o rupture of membranes about 0400, clear fluid. Having an occasional contraction, but nothing painful. Denies any bleeding. Reports good fetal movement.

## 2017-11-17 NOTE — Anesthesia Postprocedure Evaluation (Signed)
Anesthesia Post Note  Patient: Dawn Roth  Procedure(s) Performed: CESAREAN SECTION WITH BILATERAL TUBAL LIGATION (Bilateral )     Patient location during evaluation: Mother Baby Anesthesia Type: Spinal Level of consciousness: oriented and awake and alert Pain management: pain level controlled Vital Signs Assessment: post-procedure vital signs reviewed and stable Respiratory status: spontaneous breathing, respiratory function stable and patient connected to nasal cannula oxygen Cardiovascular status: blood pressure returned to baseline and stable Postop Assessment: no headache, no backache and no apparent nausea or vomiting Anesthetic complications: no    Last Vitals:  Vitals:   11/17/17 1045 11/17/17 1102  BP: 115/66 118/63  Pulse: 73 69  Resp: 17 18  Temp: (!) 36.1 C 36.4 C  SpO2: 97% 100%    Last Pain:  Vitals:   11/17/17 1110  TempSrc:   PainSc: 1    Pain Goal: Patients Stated Pain Goal: 3 (11/17/17 1110)               Gilmer Mor

## 2017-11-17 NOTE — Anesthesia Preprocedure Evaluation (Signed)
Anesthesia Evaluation  Patient identified by MRN, date of birth, ID band Patient awake    Reviewed: Allergy & Precautions, NPO status , Patient's Chart, lab work & pertinent test results, reviewed documented beta blocker date and time   Airway Mallampati: II  TM Distance: >3 FB Neck ROM: Full    Dental  (+) Teeth Intact   Pulmonary neg pulmonary ROS,    breath sounds clear to auscultation       Cardiovascular hypertension, Pt. on home beta blockers and Pt. on medications + Valvular Problems/Murmurs MR  Rhythm:Regular Rate:Normal - Systolic murmurs    Neuro/Psych negative neurological ROS  negative psych ROS   GI/Hepatic negative GI ROS, Neg liver ROS,   Endo/Other  negative endocrine ROS  Renal/GU negative Renal ROS  negative genitourinary   Musculoskeletal negative musculoskeletal ROS (+)   Abdominal (+) + obese,   Peds  Hematology  (+) anemia ,   Anesthesia Other Findings Previous C-S X 1 desires sterility  Reproductive/Obstetrics (+) Pregnancy                             Lab Results  Component Value Date   WBC 10.2 11/17/2017   HGB 11.3 (L) 11/17/2017   HCT 33.9 (L) 11/17/2017   MCV 89.2 11/17/2017   PLT 230 11/17/2017   No results found for: INR, PROTIME   Anesthesia Physical  Anesthesia Plan  ASA: III  Anesthesia Plan: Spinal   Post-op Pain Management:    Induction:   PONV Risk Score and Plan: 2 and Ondansetron and Treatment may vary due to age or medical condition  Airway Management Planned: Natural Airway  Additional Equipment:   Intra-op Plan:   Post-operative Plan:   Informed Consent: I have reviewed the patients History and Physical, chart, labs and discussed the procedure including the risks, benefits and alternatives for the proposed anesthesia with the patient or authorized representative who has indicated his/her understanding and acceptance.    Dental advisory given  Plan Discussed with: CRNA  Anesthesia Plan Comments:         Anesthesia Quick Evaluation

## 2017-11-17 NOTE — H&P (Signed)
Dawn Roth is a 37 y.o. female presenting for PROM at 400 this morning.  Preg complicated by MVP on labetalol.  AMA with normal NIPS . OB History    Gravida Para Term Preterm AB Living   4 1 1   2 1    SAB TAB Ectopic Multiple Live Births   2     0 1     Past Medical History:  Diagnosis Date  . Heart murmur    MITRAL VALUVE REGURITATION  . Hypertension    chronic HTN, on meds   Past Surgical History:  Procedure Laterality Date  . CESAREAN SECTION N/A 07/17/2015   Procedure: CESAREAN SECTION;  Surgeon: Louretta Shorten, MD;  Location: [facility];  Service: Obstetrics;  Laterality: N/A;  Primary edc 07/14/15 nkda  . CHOLECYSTECTOMY     Family History: family history includes Anxiety disorder in her sister; Brain cancer in her paternal grandmother; Depression in her sister; Heart attack in her paternal grandfather; Heart disease in her paternal aunt; Hypertension in her father; Liver cancer in her maternal grandmother. Social History:  reports that  has never smoked. she has never used smokeless tobacco. She reports that she does not drink alcohol or use drugs.     Maternal Diabetes: No Genetic Screening: Normal Maternal Ultrasounds/Referrals: Normal Fetal Ultrasounds or other Referrals:  None Maternal Substance Abuse:  No Significant Maternal Medications:  None Significant Maternal Lab Results:  None Other Comments:  None  ROS History   Blood pressure 140/84, pulse 83, temperature 98.1 F (36.7 C), temperature source Oral, resp. rate 18, height 5\' 4"  (1.626 m), weight 194 lb (88 kg), last menstrual period 02/20/2017, SpO2 98 %, unknown if currently breastfeeding. Exam Physical Exam  Prenatal labs: ABO, Rh: --/--/O POS (02/20 2025) Antibody: NEG (02/20 4270) Rubella: Immune (07/27 0000) RPR: Nonreactive (07/27 0000)  HBsAg: Negative (07/27 0000)  HIV: Non-reactive (07/27 0000)  GBS:     Assessment/Plan: IUP at term PROM Prev c/s for repeat Desires  sterilization   Dakisha Schoof C 11/17/2017, 7:33 AM

## 2017-11-17 NOTE — Progress Notes (Signed)
Orders to prep for C/S.

## 2017-11-17 NOTE — Progress Notes (Signed)
Pt to OR via stretcher.

## 2017-11-17 NOTE — Consult Note (Signed)
Neonatology Note:   Attendance at C-section:    I was asked by Dr. Julien Girt to attend this repeat C/S at 38 4/7 weeks due to SROM. The mother is a G4P1A2 O pos, GBS neg with mitral valve regurgitation, on Labetalol; otherwise uncomplicated pregnancy. ROM 4 hours prior to delivery, fluid clear. Infant was frank breech, vigorous with good spontaneous cry and tone. Delayed cord clamping was done. Needed only minimal bulb suctioning. Ap 8/9. Lungs clear to ausc in DR. Infant is able to remain with his mother for skin to skin time under nursing supervision. Transferred to the care of Pediatrician.   Real Cons, MD

## 2017-11-18 ENCOUNTER — Encounter (HOSPITAL_COMMUNITY): Payer: Self-pay | Admitting: Obstetrics and Gynecology

## 2017-11-18 LAB — CBC
HCT: 30.4 % — ABNORMAL LOW (ref 36.0–46.0)
HEMOGLOBIN: 10.1 g/dL — AB (ref 12.0–15.0)
MCH: 29.8 pg (ref 26.0–34.0)
MCHC: 33.2 g/dL (ref 30.0–36.0)
MCV: 89.7 fL (ref 78.0–100.0)
Platelets: 199 10*3/uL (ref 150–400)
RBC: 3.39 MIL/uL — AB (ref 3.87–5.11)
RDW: 14.1 % (ref 11.5–15.5)
WBC: 11.2 10*3/uL — AB (ref 4.0–10.5)

## 2017-11-18 LAB — BIRTH TISSUE RECOVERY COLLECTION (PLACENTA DONATION)

## 2017-11-18 NOTE — Progress Notes (Signed)
Subjective: Postpartum Day 1: Cesarean Delivery Patient reports tolerating PO.  Wants circ.  Objective: Vital signs in last 24 hours: Temp:  [96.9 F (36.1 C)-98.4 F (36.9 C)] 97.6 F (36.4 C) (02/21 0153) Pulse Rate:  [68-97] 82 (02/21 0656) Resp:  [15-18] 16 (02/21 0656) BP: (89-132)/(50-67) 125/57 (02/21 0656) SpO2:  [96 %-100 %] 99 % (02/21 0153)  Physical Exam:  General: alert, cooperative and appears stated age Lochia: appropriate Uterine Fundus: firm Incision: healing well, no significant drainage, no dehiscence DVT Evaluation: No evidence of DVT seen on physical exam. Negative Homan's sign. No cords or calf tenderness.  Recent Labs    11/17/17 0605 11/18/17 0455  HGB 11.3* 10.1*  HCT 33.9* 30.4*    Assessment/Plan: Status post Cesarean section. Doing well postoperatively.  Continue current care. Counseled for circ including risk of bleeding, infection, scarring.  All questions were answered.    Lerae Langham 11/18/2017, 8:30 AM

## 2017-11-18 NOTE — Lactation Note (Signed)
This note was copied from a baby's chart. Lactation Consultation Note Baby 75 hrs old, BF in cradle position. Mom states baby BF well. No issues. Denies painful latches. Mom has door knob round everted nipples.  Mom 1st child BF while in hospital, then pumped and bottle fed for 6 weeks until she went back to work. Mom stated it was just easier. Mom did supplement some w/formula during that time until switched totally to formula when stopped pumping.  Newborn feeding habits reviewed as well as STS, I&O, cluster feeding, supply and demand. Mom encouraged to feed baby 8-12 times/24 hours and with feeding cues. Call if needs assistance or has questions.   [place] brochure given w/resources, support groups and [place] services.  Patient Name: Dawn Roth [id number] Date: 11/18/2017 Reason for consult: Initial assessment   Maternal Data Has patient been taught Hand Expression?: Yes Does the patient have breastfeeding experience prior to this delivery?: Yes  Feeding Feeding Type: Breast Fed Length of feed: 15 min(still feeding)  LATCH Score Latch: Grasps breast easily, tongue down, lips flanged, rhythmical sucking.  Audible Swallowing: A few with stimulation  Type of Nipple: Everted at rest and after stimulation  Comfort (Breast/Nipple): Soft / non-tender  Hold (Positioning): No assistance needed to correctly position infant at breast.  LATCH Score: 9  Interventions Interventions: Breast feeding basics reviewed;Breast compression;Breast massage  Lactation Tools Discussed/Used WIC Program: No   Consult Status Consult Status: Follow-up Date: 11/19/17 Follow-up type: In-patient    Theodoro Kalata 11/18/2017, 4:47 AM

## 2017-11-18 NOTE — Plan of Care (Signed)
  Activity: Risk for activity intolerance will decrease 11/18/2017 1407 by Tollie Eth, RN Note Patient up in room and sitting in chair. Discussed the importance of ambulating in hallway at least three times a day. Discussed pain medication and encouraged patient to ask for prn medication before pain became significant.

## 2017-11-19 MED ORDER — OXYCODONE-ACETAMINOPHEN 5-325 MG PO TABS: 2.0000 | ORAL_TABLET | ORAL | 0 refills | Status: DC | PRN

## 2017-11-19 MED ORDER — IBUPROFEN 600 MG PO TABS: 600.0000 mg | ORAL_TABLET | Freq: Four times a day (QID) | ORAL | 0 refills | Status: DC

## 2017-11-19 NOTE — Discharge Summary (Signed)
Obstetric Discharge Summary Reason for Admission: cesarean section Prenatal Procedures: none Intrapartum Procedures: cesarean: low cervical, transverse and tubal ligation Postpartum Procedures: none Complications-Operative and Postpartum: none Hemoglobin  Date Value Ref Range Status  11/18/2017 10.1 (Roth) 12.0 - 15.0 g/dL Final   HCT  Date Value Ref Range Status  11/18/2017 30.4 (Roth) 36.0 - 46.0 % Final    Physical Exam:  General: alert, cooperative and appears stated age 37: appropriate Uterine Fundus: firm Incision: healing well, no significant drainage, no dehiscence DVT Evaluation: No evidence of DVT seen on physical exam.  Discharge Diagnoses: Term Pregnancy-delivered  Discharge Information: Date: 11/19/2017 Activity: pelvic rest Diet: routine Medications: Ibuprofen and Percocet Condition: stable Instructions: refer to practice specific booklet Discharge to: home   Newborn Data: Live born female  Birth Weight: 7 lb 4.4 oz (3300 g) APGAR: 8, 9  Newborn Delivery   Birth date/time:  11/17/2017 09:00:00 Delivery type:  C-Section, Low Transverse C-section categorization:  Repeat     Home with mother.  Dawn Roth 11/19/2017, 9:07 AM

## 2017-11-23 ENCOUNTER — Encounter (HOSPITAL_COMMUNITY)
Admission: RE | Admit: 2017-11-23 | Discharge: 2017-11-23 | Disposition: A | Discharge status: Home / Self Care | Payer: PRIVATE HEALTH INSURANCE | Source: Ambulatory Visit

## 2017-11-24 ENCOUNTER — Inpatient Hospital Stay (HOSPITAL_COMMUNITY)
Admission: AD | Admit: 2017-11-24 | Payer: PRIVATE HEALTH INSURANCE | Source: Ambulatory Visit | Admitting: Obstetrics and Gynecology

## 2017-11-24 ENCOUNTER — Encounter (HOSPITAL_COMMUNITY): Payer: Self-pay | Admitting: *Deleted

## 2018-02-03 ENCOUNTER — Encounter: Payer: Self-pay | Admitting: Family Medicine

## 2018-02-04 ENCOUNTER — Encounter: Payer: Self-pay | Admitting: Family Medicine

## 2018-02-04 ENCOUNTER — Ambulatory Visit (INDEPENDENT_AMBULATORY_CARE_PROVIDER_SITE_OTHER): Payer: 59 | Admitting: Family Medicine

## 2018-02-04 ENCOUNTER — Other Ambulatory Visit: Payer: Self-pay

## 2018-02-04 VITALS — BP 122/80 | HR 87 | Temp 98.3°F | Ht 63.25 in | Wt 178.2 lb

## 2018-02-04 DIAGNOSIS — I10 Essential (primary) hypertension: Secondary | ICD-10-CM

## 2018-02-04 DIAGNOSIS — Z0000 Encounter for general adult medical examination without abnormal findings: Secondary | ICD-10-CM | POA: Diagnosis not present

## 2018-02-04 LAB — LIPID PANEL
CHOLESTEROL: 190 mg/dL (ref 0–200)
HDL: 61 mg/dL (ref >39.00)
LDL Cholesterol: 117 mg/dL — ABNORMAL HIGH (ref 0–99)
NonHDL: 128.99
TRIGLYCERIDES: 62 mg/dL (ref 0.0–149.0)
Total CHOL/HDL Ratio: 3
VLDL: 12.4 mg/dL (ref 0.0–40.0)

## 2018-02-04 LAB — COMPREHENSIVE METABOLIC PANEL
ALBUMIN: 4.3 g/dL (ref 3.5–5.2)
ALK PHOS: 89 U/L (ref 39–117)
ALT: 13 U/L (ref 0–35)
AST: 12 U/L (ref 0–37)
BILIRUBIN TOTAL: 0.4 mg/dL (ref 0.2–1.2)
BUN: 16 mg/dL (ref 6–23)
CALCIUM: 9.2 mg/dL (ref 8.4–10.5)
CO2: 31 mEq/L (ref 19–32)
CREATININE: 0.77 mg/dL (ref 0.40–1.20)
Chloride: 104 mEq/L (ref 96–112)
GFR: 89.24 mL/min (ref >60.00)
Glucose, Bld: 90 mg/dL (ref 70–99)
Potassium: 3.9 mEq/L (ref 3.5–5.1)
Sodium: 141 mEq/L (ref 135–145)
Total Protein: 7.5 g/dL (ref 6.0–8.3)

## 2018-02-04 LAB — CBC WITH DIFFERENTIAL/PLATELET
BASOS ABS: 0.1 10*3/uL (ref 0.0–0.1)
BASOS PCT: 1.1 % (ref 0.0–3.0)
EOS ABS: 0.3 10*3/uL (ref 0.0–0.7)
Eosinophils Relative: 4.6 % (ref 0.0–5.0)
HEMATOCRIT: 36.6 % (ref 36.0–46.0)
HEMOGLOBIN: 11.9 g/dL — AB (ref 12.0–15.0)
LYMPHS PCT: 27.7 % (ref 12.0–46.0)
Lymphs Abs: 1.6 10*3/uL (ref 0.7–4.0)
MCHC: 32.6 g/dL (ref 30.0–36.0)
MCV: 89 fl (ref 78.0–100.0)
Monocytes Absolute: 0.4 10*3/uL (ref 0.1–1.0)
Monocytes Relative: 6.2 % (ref 3.0–12.0)
Neutro Abs: 3.6 10*3/uL (ref 1.4–7.7)
Neutrophils Relative %: 60.4 % (ref 43.0–77.0)
Platelets: 288 10*3/uL (ref 150.0–400.0)
RBC: 4.11 Mil/uL (ref 3.87–5.11)
RDW: 14.8 % (ref 11.5–15.5)
WBC: 5.9 10*3/uL (ref 4.0–10.5)

## 2018-02-04 LAB — TSH: TSH: 1.28 u[IU]/mL (ref 0.35–4.50)

## 2018-02-04 MED ORDER — LABETALOL HCL 100 MG PO TABS
100.0000 mg | ORAL_TABLET | Freq: Two times a day (BID) | ORAL | 3 refills | Status: DC
Start: 2018-02-04 — End: 2019-01-24

## 2018-02-04 NOTE — Patient Instructions (Addendum)
Thank you for establishing and allowing me to continue caring for you. It means a lot to me.   Please schedule a follow up appointment with me in 6 months for Blood Pressure.   Please go to the Lab for blood work.    If you have MyChart, your results will be available to view, please respond through [place] with questions.  We will schedule follow-up according to results.    Please do these things to maintain good health!   Exercise at least 30-45 minutes a day,  4-5 days a week.   Eat a low-fat diet with lots of fruits and vegetables, up to 7-9 servings per day.  Drink plenty of water daily. Try to drink 8 8oz glasses per day.  Seatbelts can save your life. Always wear your seatbelt.  Place Smoke Detectors on every level of your home and check batteries every year.  Schedule an appointment with an eye doctor for an eye exam every 1-2 years  Safe sex - use condoms to protect yourself from STDs if you could be exposed to these types of infections. Use birth control if you do not want to become pregnant and are sexually active.  Avoid heavy alcohol use. If you drink, keep it to less than 2 drinks/day and not every day.  [place].  Choose someone you trust that could speak for you if you became unable to speak for yourself.  Depression is common in our stressful world.If you're feeling down or losing interest in things you normally enjoy, please come in for a visit.  If anyone is threatening or hurting you, please get help. Physical or Emotional Violence is never OK.

## 2018-02-04 NOTE — Progress Notes (Signed)
Subjective  Chief Complaint  Patient presents with  . Establish Care    Transfer from [place], last physical 12/31/16, HM up to date     HPI: Dawn Roth is a 37 y.o. female who presents to [place] at [facility] today for a Female Wellness Visit. Former pt of mine at [employer]. 11 weeks postpartum from csection with tubal. Doing well  Wellness Visit: annual visit with health maintenance review and exam without Pap   Mother of 2 young boys, went back to work 2 weeks ago. Sleep deprived but happy. No medical concerns.   HTN - well controlled on bb. Due for labs  Mitral insufficiency: will need cards referral for local cardiologist within the next 12-18 months. Remains asymptomatic. Gets echo's intermittently to monitor.   HM: all up to date. Admits hasn't been eating well. Now that she is back to work, will do better and try to lose pregnancy weight.   Lifestyle: Body mass index is 31.32 kg/m. Wt Readings from Last 3 Encounters:  02/04/18 178 lb 3.2 oz (80.8 kg)  11/11/17 193 lb (87.5 kg)  11/17/17 194 lb (88 kg)     Patient Active Problem List   Diagnosis Date Noted  . 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 Essential hypertension 06/09/2013  . Mitral valve regurgitation 04/28/2013    Overview:  ECHO - mild - moderate, Dr. Mauricio Po    Health Maintenance  Topic Date Due  . INFLUENZA VACCINE  04/28/2018  . PAP SMEAR  11/26/2020  . TETANUS/TDAP  01/27/2027  . HIV Screening  Completed   Immunization History  Administered Date(s) Administered  . Hepatitis B, ped/adol 01/02/2014  . Influenza, Quadrivalent, Recombinant, Inj, Pf 09/08/2013  . Influenza, Seasonal, Injecte, Preservative Fre 06/29/2015  . Tdap 03/28/2006, 06/29/2015   We updated and reviewed the patient's past history in detail and it is documented below. Allergies: Patient has No Known Allergies. Past Medical History Patient  has a past medical history of Heart murmur and Hypertension. Past  Surgical History Patient  has a past surgical history that includes Cholecystectomy (09/2011); Cesarean section (N/A, 07/17/2015); and Cesarean section with bilateral tubal ligation (Bilateral, 11/17/2017). Family History: Patient family history includes Alcohol abuse in her father; Anxiety disorder in her sister; Arthritis in her father; Brain cancer in her paternal grandmother; Depression in her sister; Healthy in her son and son; Heart attack in her paternal grandfather; Heart disease in her paternal aunt; Hypertension in her father; Liver cancer in her maternal grandmother. Social History:  Patient  reports that she has never smoked. She has never used smokeless tobacco. She reports that she drinks alcohol. She reports that she does not use drugs.  Review of Systems: Constitutional: negative for fever or malaise Ophthalmic: negative for photophobia, double vision or loss of vision Cardiovascular: negative for chest pain, dyspnea on exertion, or new LE swelling Respiratory: negative for SOB or persistent cough Gastrointestinal: negative for abdominal pain, change in bowel habits or melena Genitourinary: negative for dysuria or gross hematuria, no abnormal uterine bleeding or disharge Musculoskeletal: negative for new gait disturbance or muscular weakness Integumentary: negative for new or persistent rashes, no breast lumps Neurological: negative for TIA or stroke symptoms Psychiatric: negative for SI or delusions Allergic/Immunologic: negative for hives Patient Care Team    Relationship Specialty Notifications Start End  Leamon Arnt, MD PCP - General Family Medicine  02/04/18     Objective  Vitals: BP 122/80   Pulse 87   Temp 98.3 F (36.8  C)   Ht 5' 3.25" (1.607 m)   Wt 178 lb 3.2 oz (80.8 kg)   LMP 01/31/2018   BMI 31.32 kg/m  General:  Well developed, well nourished, no acute distress  Psych:  Alert and orientedx3,normal mood and affect HEENT:  Normocephalic, atraumatic,  non-icteric sclera, PERRL, oropharynx is clear without mass or exudate, supple neck without adenopathy, mass or thyromegaly Cardiovascular:  Normal S1, S2, RRR without gallop, rub or murmur, nondisplaced PMI Respiratory:  Good breath sounds bilaterally, CTAB with normal respiratory effort Gastrointestinal: normal bowel sounds, soft, non-tender, no noted masses. No HSM MSK: no deformities, contusions. Joints are without erythema or swelling. Spine and CVA region are nontender Skin:  Warm, no rashes or suspicious lesions noted Neurologic:    Mental status is normal. CN 2-11 are normal. Gross motor and sensory exams are normal. Normal gait. No tremor Breast Exam: deferred due to postpartum status Assessment  1. Annual physical exam   2. Essential hypertension      Plan  Female Wellness Visit:  Age appropriate Health Maintenance and Prevention measures were discussed with patient. Included topics are cancer screening recommendations, ways to keep healthy (see AVS) including dietary and exercise recommendations, regular eye and dental care, use of seat belts, and avoidance of moderate alcohol use and tobacco use.   BMI: discussed patient's BMI and encouraged positive lifestyle modifications to help get to or maintain a target BMI.  HM needs and immunizations were addressed and ordered. See below for orders. See HM and immunization section for updates.  Routine labs and screening tests ordered including cmp, cbc and lipids where appropriate.  Discussed recommendations regarding Vit D and calcium supplementation (see AVS)  HTN; well controlled. Check labs. Will refer to cards next visit.   Follow up: 6 months for f/u htn    Commons side effects, risks, benefits, and alternatives for medications and treatment plan prescribed today were discussed, and the patient expressed understanding of the given instructions. Patient is instructed to call or message via MyChart if he/she has any questions  or concerns regarding our treatment plan. No barriers to understanding were identified. We discussed Red Flag symptoms and signs in detail. Patient expressed understanding regarding what to do in case of urgent or emergency type symptoms.   Medication list was reconciled, printed and provided to the patient in AVS. Patient instructions and summary information was reviewed with the patient as documented in the AVS. This note was prepared with assistance of Dragon voice recognition software. Occasional wrong-word or sound-a-like substitutions may have occurred due to the inherent limitations of voice recognition software  Orders Placed This Encounter  Procedures  . CBC with Differential/Platelet  . Comprehensive metabolic panel  . Lipid panel  . TSH   Meds ordered this encounter  Medications  . labetalol (NORMODYNE) 100 MG tablet    Sig: Take 1 tablet (100 mg total) by mouth 2 (two) times daily.    Dispense:  180 tablet    Refill:  3

## 2018-06-29 ENCOUNTER — Other Ambulatory Visit: Payer: Self-pay

## 2018-06-29 ENCOUNTER — Encounter: Payer: Self-pay | Admitting: Physician Assistant

## 2018-06-29 ENCOUNTER — Ambulatory Visit: Payer: 59 | Admitting: Physician Assistant

## 2018-06-29 VITALS — BP 118/72 | HR 84 | Temp 98.2°F | Ht 63.25 in | Wt 181.8 lb

## 2018-06-29 DIAGNOSIS — Z23 Encounter for immunization: Secondary | ICD-10-CM

## 2018-06-29 DIAGNOSIS — R42 Dizziness and giddiness: Secondary | ICD-10-CM

## 2018-06-29 MED ORDER — FLUTICASONE PROPIONATE 50 MCG/ACT NA SUSP: 2.0000 | Freq: Every day | NASAL | 0 refills | Status: DC

## 2018-06-29 MED ORDER — MECLIZINE HCL 25 MG PO TABS: 25.0000 mg | ORAL_TABLET | Freq: Three times a day (TID) | ORAL | 0 refills | Status: DC | PRN

## 2018-06-29 NOTE — Progress Notes (Signed)
Patient presents to clinic today c/o episode of vertigo occurring yesterday afternoon while trying to take care of the kids. Notes the episode lasted a couple of minutes. Notes she sat down immediately and tried to relax. Denies LH. Denies chest pain, SOB, palpitations. Has noted some mild nasal congestion and ear pressure. Denies recent change in medication or diet.  Denies recurrent episode. Did have some off-balance this morning that has resolved. Feels fine presently.  Past Medical History:  Diagnosis Date  . Heart murmur    MITRAL VALUVE REGURITATION  . Hypertension    chronic HTN, on meds    Current Outpatient Medications on File Prior to Visit  Medication Sig Dispense Refill  . labetalol (NORMODYNE) 100 MG tablet Take 1 tablet (100 mg total) by mouth 2 (two) times daily. 180 tablet 3   No current facility-administered medications on file prior to visit.     No Known Allergies  Family History  Problem Relation Age of Onset  . Hypertension Father   . Alcohol abuse Father   . Arthritis Father   . Depression Sister   . Anxiety disorder Sister   . Heart disease Paternal Aunt   . Liver cancer Maternal Grandmother   . Brain cancer Paternal Grandmother   . Heart attack Paternal Grandfather   . Healthy Son   . Healthy Son     Social History   Socioeconomic History  . Marital status: Married    Spouse name: Not on file  . Number of children: 2  . Years of education: Not on file  . Highest education level: Not on file  Occupational History  . Occupation: DENTAL ASSISTANCE    Employer: [employer]  Social Needs  . Financial resource strain: Not on file  . Food insecurity:    Worry: Not on file    Inability: Not on file  . Transportation needs:    Medical: Not on file    Non-medical: Not on file  Tobacco Use  . Smoking status: Never Smoker  . Smokeless tobacco: Never Used  Substance and Sexual Activity  . Alcohol use: Yes  . Drug use: No  . Sexual  activity: Yes    Birth control/protection: Surgical  Lifestyle  . Physical activity:    Days per week: Not on file    Minutes per session: Not on file  . Stress: Not on file  Relationships  . Social connections:    Talks on phone: Not on file    Gets together: Not on file    Attends religious service: Not on file    Active member of club or organization: Not on file    Attends meetings of clubs or organizations: Not on file    Relationship status: Not on file  Other Topics Concern  . Not on file  Social History Narrative  . Not on file   Review of Systems - See HPI.  All other ROS are negative.  BP 118/72   Pulse 84   Temp 98.2 F (36.8 C)   Ht 5' 3.25" (1.607 m)   Wt 181 lb 12.8 oz (82.5 kg)   BMI 31.95 kg/m   Physical Exam  Constitutional: She is oriented to person, place, and time. She appears well-developed and well-nourished.  HENT:  Head: Normocephalic and atraumatic.  Right Ear: External ear normal.  Left Ear: External ear normal. A middle ear effusion (serous fluid) is present.  Nose: Nose normal.  Mouth/Throat: Oropharynx is clear and moist.  No oropharyngeal exudate.  Eyes: Pupils are equal, round, and reactive to light. Conjunctivae are normal.  Neck: Neck supple. No thyromegaly present.  Cardiovascular: Normal rate, regular rhythm, normal heart sounds and intact distal pulses.  Pulmonary/Chest: Effort normal and breath sounds normal. No stridor. No respiratory distress. She has no wheezes. She has no rales. She exhibits no tenderness.  Neurological: She is alert and oriented to person, place, and time. No cranial nerve deficit. Coordination normal.  Psychiatric: She has a normal mood and affect.  Vitals reviewed.  Assessment/Plan: 1. Vertigo One episode yesterday. Mild off-balance this morning that has resolved. No alarm signs/symptoms. Exam unremarkable except for some mild serous fluid behind TM. Will start Flonase and give Rx of Meclizine to have on  hand. Follow-up if not continuing to improve/resolve or if new symptoms develop. - fluticasone (FLONASE) 50 MCG/ACT nasal spray; Place 2 sprays into both nostrils daily.  Dispense: 16 g; Refill: 0 - meclizine (ANTIVERT) 25 MG tablet; Take 1 tablet (25 mg total) by mouth 3 (three) times daily as needed for dizziness.  Dispense: 30 tablet; Refill: 0   Leeanne Rio, PA-C

## 2018-06-29 NOTE — Patient Instructions (Addendum)
Please keep well-hydrated and get plenty of rest.  The vertigo seems positional and benign, but the fluid behind your ear drum might be contributing.  Start the [facility] as directed. Meclizine if needed for dizziness.  Since symptoms are already improving,suspect this to continue to improve and resolve. Can do the exercises below to help.   How to Perform the Epley Maneuver The Epley maneuver is an exercise that relieves symptoms of vertigo. Vertigo is the feeling that you or your surroundings are moving when they are not. When you feel vertigo, you may feel like the room is spinning and have trouble walking. Dizziness is a little different than vertigo. When you are dizzy, you may feel unsteady or light-headed. You can do this maneuver at home whenever you have symptoms of vertigo. You can do it up to 3 times a day until your symptoms go away. Even though the Epley maneuver may relieve your vertigo for a few weeks, it is possible that your symptoms will return. This maneuver relieves vertigo, but it does not relieve dizziness. What are the risks? If it is done correctly, the Epley maneuver is considered safe. Sometimes it can lead to dizziness or nausea that goes away after a short time. If you develop other symptoms, such as changes in vision, weakness, or numbness, stop doing the maneuver and call your health care provider. How to perform the Epley maneuver 1. Sit on the edge of a bed or table with your back straight and your legs extended or hanging over the edge of the bed or table. 2. Turn your head halfway toward the affected ear or side. 3. Lie backward quickly with your head turned until you are lying flat on your back. You may want to position a pillow under your shoulders. 4. Hold this position for 30 seconds. You may experience an attack of vertigo. This is normal. 5. Turn your head to the opposite direction until your unaffected ear is facing the floor. 6. Hold this position for 30  seconds. You may experience an attack of vertigo. This is normal. Hold this position until the vertigo stops. 7. Turn your whole body to the same side as your head. Hold for another 30 seconds. 8. Sit back up. You can repeat this exercise up to 3 times a day. Follow these instructions at home:  After doing the Epley maneuver, you can return to your normal activities.  Ask your health care provider if there is anything you should do at home to prevent vertigo. He or she may recommend that you: ? Keep your head raised (elevated) with two or more pillows while you sleep. ? Do not sleep on the side of your affected ear. ? Get up slowly from bed. ? Avoid sudden movements during the day. ? Avoid extreme head movement, like looking up or bending over. Contact a health care provider if:  Your vertigo gets worse.  You have other symptoms, including: ? Nausea. ? Vomiting. ? Headache. Get help right away if:  You have vision changes.  You have a severe or worsening headache or neck pain.  You cannot stop vomiting.  You have new numbness or weakness in any part of your body. Summary  Vertigo is the feeling that you or your surroundings are moving when they are not.  The Epley maneuver is an exercise that relieves symptoms of vertigo.  If the Epley maneuver is done correctly, it is considered safe. You can do it up to 3 times a  day. This information is not intended to replace advice given to you by your health care provider. Make sure you discuss any questions you have with your health care provider. Document Released: 09/19/2013 Document Revised: 08/04/2016 Document Reviewed: 08/04/2016 Elsevier Interactive Patient Education  2017 [employer].

## 2018-07-21 ENCOUNTER — Other Ambulatory Visit: Payer: Self-pay | Admitting: Physician Assistant

## 2018-07-21 DIAGNOSIS — R42 Dizziness and giddiness: Secondary | ICD-10-CM

## 2018-08-05 ENCOUNTER — Other Ambulatory Visit: Payer: Self-pay

## 2018-08-05 ENCOUNTER — Encounter: Payer: Self-pay | Admitting: Family Medicine

## 2018-08-05 ENCOUNTER — Ambulatory Visit: Payer: 59 | Admitting: Family Medicine

## 2018-08-05 VITALS — BP 120/86 | HR 85 | Temp 97.9°F | Ht 63.25 in | Wt 182.8 lb

## 2018-08-05 DIAGNOSIS — I340 Nonrheumatic mitral (valve) insufficiency: Secondary | ICD-10-CM | POA: Diagnosis not present

## 2018-08-05 DIAGNOSIS — I10 Essential (primary) hypertension: Secondary | ICD-10-CM | POA: Diagnosis not present

## 2018-08-05 NOTE — Patient Instructions (Addendum)
Please return in May 2020 for your annual complete physical; please come fasting.  We will call you with information regarding your referral appointment. Echocardiogram.  If you do not hear from [place] within the next 2 weeks, please let me know. It can take 1-2 weeks to get appointments set up with the specialists.    If you have any questions or concerns, please don't hesitate to send me a message via MyChart or call the office at [phone]. Thank you for visiting with [place] today! It's our pleasure caring for you.

## 2018-08-05 NOTE — Progress Notes (Signed)
Subjective  CC:  Chief Complaint  Patient presents with  . Hypertension    doing well on medications    HPI: Dawn Roth is a 37 y.o. female who presents to the office today to address the problems listed above in the chief complaint.  Hypertension f/u: Control is fair . Pt reports she is doing well. taking medications as instructed, no medication side effects noted, no TIAs, no chest pain on exertion, no dyspnea on exertion, no swelling of ankles. She denies adverse effects from his BP medications. Compliance with medication is good.   Mitral insufficiency; reviewed cards note from 2017. She is asymptomatic. No palpitations or sob or lightheadedness.   Assessment  1. Essential hypertension   2. Nonrheumatic mitral valve regurgitation      Plan    Hypertension f/u: BP control is well controlled. Home readings are much better by pt report. Continue same meds  Mitral regurg f/u: check 2D echo to ensure stable. Clinically is doing well.   Education regarding management of these chronic disease states was given. Management strategies discussed on successive visits include dietary and exercise recommendations, goals of achieving and maintaining IBW, and lifestyle modifications aiming for adequate sleep and minimizing stressors.   Follow up: No follow-ups on file.  Orders Placed This Encounter  Procedures  . ECHOCARDIOGRAM COMPLETE   No orders of the defined types were placed in this encounter.     BP Readings from Last 3 Encounters:  08/05/18 120/86  06/29/18 118/72  02/04/18 122/80   Wt Readings from Last 3 Encounters:  08/05/18 182 lb 12.8 oz (82.9 kg)  06/29/18 181 lb 12.8 oz (82.5 kg)  02/04/18 178 lb 3.2 oz (80.8 kg)    Lab Results  Component Value Date   CHOL 190 02/04/2018   Lab Results  Component Value Date   HDL 61.00 02/04/2018   Lab Results  Component Value Date   LDLCALC 117 (H) 02/04/2018   Lab Results  Component Value Date   TRIG 62.0  02/04/2018   Lab Results  Component Value Date   CHOLHDL 3 02/04/2018   No results found for: LDLDIRECT Lab Results  Component Value Date   CREATININE 0.77 02/04/2018   BUN 16 02/04/2018   NA 141 02/04/2018   K 3.9 02/04/2018   CL 104 02/04/2018   CO2 31 02/04/2018    The ASCVD Risk score (Goff DC Jr., et al., 2013) failed to calculate for the following reasons:   The 2013 ASCVD risk score is only valid for ages 5 to 70  I reviewed the patients updated PMH, FH, and SocHx.    Patient Active Problem List   Diagnosis Date Noted  . 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 Essential hypertension 06/09/2013  . Mitral valve regurgitation 04/28/2013    Allergies: Patient has no known allergies.  Social History: Patient  reports that she has never smoked. She has never used smokeless tobacco. She reports that she drinks alcohol. She reports that she does not use drugs.  Current Meds  Medication Sig  . labetalol (NORMODYNE) 100 MG tablet Take 1 tablet (100 mg total) by mouth 2 (two) times daily.    Review of Systems: Cardiovascular: negative for chest pain, palpitations, leg swelling, orthopnea Respiratory: negative for SOB, wheezing or persistent cough Gastrointestinal: negative for abdominal pain Genitourinary: negative for dysuria or gross hematuria  Objective  Vitals: BP 120/86   Pulse 85   Temp 97.9 F (36.6 C)   Ht 5' 3.25" (1.607 m)  Wt 182 lb 12.8 oz (82.9 kg)   SpO2 98%   BMI 32.13 kg/m  General: no acute distress  Psych:  Alert and oriented, normal mood and affect HEENT:  Normocephalic, atraumatic, supple neck  Cardiovascular:  RRR without murmur. no edema Respiratory:  Good breath sounds bilaterally, CTAB with normal respiratory effort Skin:  Warm, no rashes Neurologic:   Mental status is normal  Commons side effects, risks, benefits, and alternatives for medications and treatment plan prescribed today were discussed, and the patient expressed understanding of  the given instructions. Patient is instructed to call or message via MyChart if he/she has any questions or concerns regarding our treatment plan. No barriers to understanding were identified. We discussed Red Flag symptoms and signs in detail. Patient expressed understanding regarding what to do in case of urgent or emergency type symptoms.   Medication list was reconciled, printed and provided to the patient in AVS. Patient instructions and summary information was reviewed with the patient as documented in the AVS. This note was prepared with assistance of Dragon voice recognition software. Occasional wrong-word or sound-a-like substitutions may have occurred due to the inherent limitations of voice recognition software

## 2018-08-11 ENCOUNTER — Telehealth: Payer: Self-pay

## 2018-08-11 NOTE — Telephone Encounter (Signed)
Copied from [place] [id number]. Topic: General - Inquiry >> Aug 11, 2018  2:43 PM Vernona Rieger wrote: Reason for CRM:   Dawn Roth, with [facility] on [address] called and stated that she has an appointment tomorrow for an echo at 2:30. She states they need a pre-cert through her insurance or they will have to cancel her appointment. Please just let them know through epic that it has been done and the number. Thanks

## 2018-08-12 ENCOUNTER — Ambulatory Visit (HOSPITAL_COMMUNITY): Payer: 59 | Attending: Cardiovascular Disease

## 2018-08-12 ENCOUNTER — Ambulatory Visit: Payer: 59 | Admitting: Family Medicine

## 2018-08-12 ENCOUNTER — Other Ambulatory Visit: Payer: Self-pay

## 2018-08-12 DIAGNOSIS — I340 Nonrheumatic mitral (valve) insufficiency: Secondary | ICD-10-CM

## 2018-08-12 NOTE — Telephone Encounter (Signed)
Echo approved from [place] Ref # [id number]  Valid from 08/11/2018-09/25/2018

## 2018-11-10 ENCOUNTER — Ambulatory Visit: Payer: 59 | Admitting: Physician Assistant

## 2018-11-10 ENCOUNTER — Other Ambulatory Visit: Payer: Self-pay

## 2018-11-10 ENCOUNTER — Encounter: Payer: Self-pay | Admitting: Physician Assistant

## 2018-11-10 VITALS — BP 100/70 | HR 96 | Temp 97.8°F | Resp 14 | Ht 63.0 in | Wt 184.0 lb

## 2018-11-10 DIAGNOSIS — J0100 Acute maxillary sinusitis, unspecified: Secondary | ICD-10-CM | POA: Diagnosis not present

## 2018-11-10 MED ORDER — AMOXICILLIN-POT CLAVULANATE 875-125 MG PO TABS: 1.0000 | ORAL_TABLET | Freq: Two times a day (BID) | ORAL | 0 refills | Status: DC

## 2018-11-10 NOTE — Patient Instructions (Signed)
Please take antibiotic as directed.  Increase fluid intake.  Use Saline nasal spray.  Take a daily multivitamin. Start Tylenol Sinus.  Place a humidifier in the bedroom.  Please call or return clinic if symptoms are not improving.  Sinusitis Sinusitis is redness, soreness, and swelling (inflammation) of the paranasal sinuses. Paranasal sinuses are air pockets within the bones of your face (beneath the eyes, the middle of the forehead, or above the eyes). In healthy paranasal sinuses, mucus is able to drain out, and air is able to circulate through them by way of your nose. However, when your paranasal sinuses are inflamed, mucus and air can become trapped. This can allow bacteria and other germs to grow and cause infection. Sinusitis can develop quickly and last only a short time (acute) or continue over a long period (chronic). Sinusitis that lasts for more than 12 weeks is considered chronic.  CAUSES  Causes of sinusitis include:  Allergies.  Structural abnormalities, such as displacement of the cartilage that separates your nostrils (deviated septum), which can decrease the air flow through your nose and sinuses and affect sinus drainage.  Functional abnormalities, such as when the small hairs (cilia) that line your sinuses and help remove mucus do not work properly or are not present. SYMPTOMS  Symptoms of acute and chronic sinusitis are the same. The primary symptoms are pain and pressure around the affected sinuses. Other symptoms include:  Upper toothache.  Earache.  Headache.  Bad breath.  Decreased sense of smell and taste.  A cough, which worsens when you are lying flat.  Fatigue.  Fever.  Thick drainage from your nose, which often is green and may contain pus (purulent).  Swelling and warmth over the affected sinuses. DIAGNOSIS  Your caregiver will perform a physical exam. During the exam, your caregiver may:  Look in your nose for signs of abnormal growths in your  nostrils (nasal polyps).  Tap over the affected sinus to check for signs of infection.  View the inside of your sinuses (endoscopy) with a special imaging device with a light attached (endoscope), which is inserted into your sinuses. If your caregiver suspects that you have chronic sinusitis, one or more of the following tests may be recommended:  Allergy tests.  Nasal culture A sample of mucus is taken from your nose and sent to a lab and screened for bacteria.  Nasal cytology A sample of mucus is taken from your nose and examined by your caregiver to determine if your sinusitis is related to an allergy. TREATMENT  Most cases of acute sinusitis are related to a viral infection and will resolve on their own within 10 days. Sometimes medicines are prescribed to help relieve symptoms (pain medicine, decongestants, nasal steroid sprays, or saline sprays).  However, for sinusitis related to a bacterial infection, your caregiver will prescribe antibiotic medicines. These are medicines that will help kill the bacteria causing the infection.  Rarely, sinusitis is caused by a fungal infection. In theses cases, your caregiver will prescribe antifungal medicine. For some cases of chronic sinusitis, surgery is needed. Generally, these are cases in which sinusitis recurs more than 3 times per year, despite other treatments. HOME CARE INSTRUCTIONS   Drink plenty of water. Water helps thin the mucus so your sinuses can drain more easily.  Use a humidifier.  Inhale steam 3 to 4 times a day (for example, sit in the bathroom with the shower running).  Apply a warm, moist washcloth to your face 3 to 4  times a day, or as directed by your caregiver.  Use saline nasal sprays to help moisten and clean your sinuses.  Take over-the-counter or prescription medicines for pain, discomfort, or fever only as directed by your caregiver. SEEK IMMEDIATE MEDICAL CARE IF:  You have increasing pain or severe  headaches.  You have nausea, vomiting, or drowsiness.  You have swelling around your face.  You have vision problems.  You have a stiff neck.  You have difficulty breathing. MAKE SURE YOU:   Understand these instructions.  Will watch your condition.  Will get help right away if you are not doing well or get worse. Document Released: 09/14/2005 Document Revised: 12/07/2011 Document Reviewed: 09/29/2011 ExitCare Patient Information 2014 [employer].   

## 2018-11-10 NOTE — Progress Notes (Signed)
Patient presents to clinic today c/o progressively worsening nasal congestion, sinus pressure and sinus pain. Symptoms began last week and worsening since that time. Now with ear pain, tooth pain and facial pain. Notes intermittent fevers since onset with Tmax 101. Has been taking Motrin for pain and fever. Denies recent travel or sick contact.   Past Medical History:  Diagnosis Date  . Heart murmur    MITRAL VALUVE REGURITATION  . Hypertension    chronic HTN, on meds    Current Outpatient Medications on File Prior to Visit  Medication Sig Dispense Refill  . labetalol (NORMODYNE) 100 MG tablet Take 1 tablet (100 mg total) by mouth 2 (two) times daily. 180 tablet 3   No current facility-administered medications on file prior to visit.     No Known Allergies  Family History  Problem Relation Age of Onset  . Hypertension Father   . Alcohol abuse Father   . Arthritis Father   . Depression Sister   . Anxiety disorder Sister   . Heart disease Paternal Aunt   . Liver cancer Maternal Grandmother   . Brain cancer Paternal Grandmother   . Heart attack Paternal Grandfather   . Healthy Son   . Healthy Son     Social History   Socioeconomic History  . Marital status: Married    Spouse name: Not on file  . Number of children: 2  . Years of education: Not on file  . Highest education level: Not on file  Occupational History  . Occupation: DENTAL ASSISTANCE    Employer: [employer]  Social Needs  . Financial resource strain: Not on file  . Food insecurity:    Worry: Not on file    Inability: Not on file  . Transportation needs:    Medical: Not on file    Non-medical: Not on file  Tobacco Use  . Smoking status: Never Smoker  . Smokeless tobacco: Never Used  Substance and Sexual Activity  . Alcohol use: Yes  . Drug use: No  . Sexual activity: Yes    Birth control/protection: Surgical  Lifestyle  . Physical activity:    Days per week: Not on file    Minutes  per session: Not on file  . Stress: Not on file  Relationships  . Social connections:    Talks on phone: Not on file    Gets together: Not on file    Attends religious service: Not on file    Active member of club or organization: Not on file    Attends meetings of clubs or organizations: Not on file    Relationship status: Not on file  Other Topics Concern  . Not on file  Social History Narrative  . Not on file    Review of Systems - See HPI.  All other ROS are negative.  BP 100/70   Pulse 96   Temp 97.8 F (36.6 C) (Oral)   Resp 14   Ht 5\' 3"  (1.6 m)   Wt 184 lb (83.5 kg)   SpO2 99%   Breastfeeding No   BMI 32.59 kg/m   Physical Exam Vitals signs reviewed.  Constitutional:      Appearance: Normal appearance.  HENT:     Head: Normocephalic and atraumatic.     Right Ear: Tympanic membrane normal.     Left Ear: Tympanic membrane normal.     Nose: Congestion and rhinorrhea present.     Right Sinus: Maxillary sinus tenderness present. No  frontal sinus tenderness.     Left Sinus: Maxillary sinus tenderness present. No frontal sinus tenderness.     Mouth/Throat:     Mouth: Mucous membranes are moist.  Eyes:     Conjunctiva/sclera: Conjunctivae normal.  Neck:     Musculoskeletal: Neck supple.  Cardiovascular:     Rate and Rhythm: Normal rate and regular rhythm.     Heart sounds: Normal heart sounds.  Pulmonary:     Effort: Pulmonary effort is normal.     Breath sounds: Normal breath sounds.  Lymphadenopathy:     Cervical: No cervical adenopathy.  Neurological:     General: No focal deficit present.     Mental Status: She is alert and oriented to person, place, and time.  Psychiatric:        Mood and Affect: Mood normal.    Assessment/Plan: 1. Acute non-recurrent maxillary sinusitis Rx Augmentin.  Increase fluids.  Rest.  Saline nasal spray.  Probiotic.  Mucinex as directed.  Humidifier in bedroom.  Call or return to clinic if symptoms are not improving.  -  amoxicillin-clavulanate (AUGMENTIN) 875-125 MG tablet; Take 1 tablet by mouth 2 (two) times daily.  Dispense: 14 tablet; Refill: 0   Leeanne Rio, PA-C

## 2019-01-24 ENCOUNTER — Other Ambulatory Visit: Payer: Self-pay | Admitting: Family Medicine

## 2019-02-09 ENCOUNTER — Encounter: Payer: Self-pay | Admitting: Family Medicine

## 2019-02-10 ENCOUNTER — Encounter: Payer: Self-pay | Admitting: Family Medicine

## 2019-02-10 ENCOUNTER — Ambulatory Visit (INDEPENDENT_AMBULATORY_CARE_PROVIDER_SITE_OTHER): Payer: 59 | Admitting: Family Medicine

## 2019-02-10 ENCOUNTER — Encounter: Payer: 59 | Admitting: Family Medicine

## 2019-02-10 ENCOUNTER — Other Ambulatory Visit: Payer: Self-pay

## 2019-02-10 VITALS — BP 108/71 | HR 91 | Temp 98.3°F | Ht 63.0 in | Wt 183.0 lb

## 2019-02-10 DIAGNOSIS — I10 Essential (primary) hypertension: Secondary | ICD-10-CM

## 2019-02-10 DIAGNOSIS — I340 Nonrheumatic mitral (valve) insufficiency: Secondary | ICD-10-CM

## 2019-02-10 MED ORDER — LABETALOL HCL 100 MG PO TABS: 100.0000 mg | ORAL_TABLET | Freq: Two times a day (BID) | ORAL | 3 refills | Status: DC

## 2019-02-10 NOTE — Assessment & Plan Note (Signed)
This medical condition is well controlled. There are no signs of complications, medication side effects, or red flags. Patient is instructed to continue the current treatment plan without change in therapies or medications.   

## 2019-02-10 NOTE — Patient Instructions (Signed)
Please follow up as scheduled for your next visit with me: 05/12/2019   If you have any questions or concerns, please don't hesitate to send me a message via MyChart or call the office at [phone]. Thank you for visiting with [place] today! It's our pleasure caring for you.

## 2019-02-10 NOTE — Assessment & Plan Note (Signed)
Doing fine clinically. Has been stable. Repeat echo in 1-2 years.

## 2019-02-10 NOTE — Progress Notes (Signed)
I have discussed the procedure for the virtual visit with the patient who has given consent to proceed with assessment and treatment.   Tiara S Simmons, CMA     

## 2019-02-10 NOTE — Progress Notes (Signed)
Virtual Visit via Video Note  Subjective  CC:  Chief Complaint  Patient presents with  . Hypertension     I connected with Dawn Roth on 02/10/19 at  8:40 AM EDT by a video enabled telemedicine application and verified that I am speaking with the correct person using two identifiers. Location patient: Home Location provider: [facility] [place] participating in the virtual visit: Arma Reining, Leamon Arnt, MD Lilli Light, [place] discussed the limitations of evaluation and management by telemedicine and the availability of in person appointments. The patient expressed understanding and agreed to proceed. HPI: Dawn Roth is a 38 y.o. female who was contacted today to address the problems listed above in the chief complaint/HTN f/u:  . Hypertension f/u: Control is good . Pt reports she is doing well. taking medications as instructed, no medication side effects noted, no TIAs, no chest pain on exertion, no dyspnea on exertion, no swelling of ankles. Needs refills.  She denies adverse effects from his BP medications. Compliance with medication is good.  . Mild mitral regurgitation: asymptomatic. Reviewed Echocardiogram from last year: mild regurg otherwise nl study.   BP Readings from Last 3 Encounters:  02/10/19 108/71  11/10/18 100/70  08/05/18 120/86   Wt Readings from Last 3 Encounters:  02/10/19 183 lb (83 kg)  11/10/18 184 lb (83.5 kg)  08/05/18 182 lb 12.8 oz (82.9 kg)    Lab Results  Component Value Date   CHOL 190 02/04/2018   Lab Results  Component Value Date   HDL 61.00 02/04/2018   Lab Results  Component Value Date   LDLCALC 117 (H) 02/04/2018   Lab Results  Component Value Date   TRIG 62.0 02/04/2018   Lab Results  Component Value Date   CHOLHDL 3 02/04/2018   No results found for: LDLDIRECT Lab Results  Component Value Date   CREATININE 0.77 02/04/2018   BUN 16 02/04/2018   NA 141 02/04/2018   K 3.9  02/04/2018   CL 104 02/04/2018   CO2 31 02/04/2018    The ASCVD Risk score (Goff DC Jr., et al., 2013) failed to calculate for the following reasons:   The 2013 ASCVD risk score is only valid for ages 32 to 60  Assessment  1. Essential hypertension   2. Nonrheumatic mitral valve regurgitation      Plan   See below for problem based assessment and plan documentation  I discussed the assessment and treatment plan with the patient. The patient was provided an opportunity to ask questions and all were answered. The patient agreed with the plan and demonstrated an understanding of the instructions.   The patient was advised to call back or seek an in-person evaluation if the symptoms worsen or if the condition fails to improve as anticipated. Follow up: Return for as scheduled, complete physical.  05/12/2019  Meds ordered this encounter  Medications  . labetalol (NORMODYNE) 100 MG tablet    Sig: Take 1 tablet (100 mg total) by mouth 2 (two) times daily.    Dispense:  180 tablet    Refill:  3      I reviewed the patients updated PMH, FH, and SocHx.    Patient Active Problem List   Diagnosis Date Noted  . 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 Essential hypertension 06/09/2013  . Mitral valve regurgitation 04/28/2013   Current Meds  Medication Sig  . labetalol (NORMODYNE) 100 MG tablet Take 1 tablet (100  mg total) by mouth 2 (two) times daily.  . [DISCONTINUED] labetalol (NORMODYNE) 100 MG tablet TAKE ONE TABLET BY MOUTH TWICE DAILY    Allergies: Patient has No Known Allergies. Family History: Patient family history includes Alcohol abuse in her father; Anxiety disorder in her sister; Arthritis in her father; Brain cancer in her paternal grandmother; Depression in her sister; Healthy in her son and son; Heart attack in her paternal grandfather; Heart disease in her paternal aunt; Hypertension in her father; Liver cancer in her maternal grandmother. Social History:  Patient  reports  that she has never smoked. She has never used smokeless tobacco. She reports current alcohol use. She reports that she does not use drugs.  Review of Systems: Constitutional: Negative for fever malaise or anorexia Cardiovascular: negative for chest pain Respiratory: negative for SOB or persistent cough Gastrointestinal: negative for abdominal pain  OBJECTIVE Vitals: BP 108/71   Pulse 91   Temp 98.3 F (36.8 C) (Oral)   Ht 5\' 3"  (1.6 m)   Wt 183 lb (83 kg)   LMP 02/06/2019   BMI 32.42 kg/m  General: no acute distress , A&Ox3  Leamon Arnt, MD

## 2019-05-12 ENCOUNTER — Encounter: Payer: 59 | Admitting: Family Medicine

## 2019-06-01 ENCOUNTER — Telehealth: Payer: Self-pay | Admitting: Physical Therapy

## 2019-06-01 NOTE — Telephone Encounter (Signed)
Routing to both [employer] and front desk as not sure who needs to decide/respond. Cloyde Reams Copied from [place] [phone]. Topic: General - Other >> Jun 01, 2019  9:28 AM Dawn Roth wrote: Reason for CRM: pt called and stated that she has a cough and would like to know if she can still come in for her appointment tomorrow. Please advise

## 2019-06-01 NOTE — Telephone Encounter (Signed)
That is correct, thanks.

## 2019-06-01 NOTE — Telephone Encounter (Signed)
Please advise if it is ok for pt to come in office for appt tomorrow? Thank you!

## 2019-06-01 NOTE — Telephone Encounter (Signed)
I will call and find out, with just a cough and no other symptoms is pt OK to come in?

## 2019-06-01 NOTE — Telephone Encounter (Signed)
Are there any other symptoms?

## 2019-06-02 ENCOUNTER — Ambulatory Visit (INDEPENDENT_AMBULATORY_CARE_PROVIDER_SITE_OTHER): Payer: 59 | Admitting: Family Medicine

## 2019-06-02 ENCOUNTER — Other Ambulatory Visit: Payer: Self-pay

## 2019-06-02 ENCOUNTER — Encounter: Payer: Self-pay | Admitting: Family Medicine

## 2019-06-02 VITALS — BP 124/76 | HR 85 | Temp 97.8°F | Resp 16 | Ht 63.0 in | Wt 181.0 lb

## 2019-06-02 DIAGNOSIS — Z23 Encounter for immunization: Secondary | ICD-10-CM | POA: Diagnosis not present

## 2019-06-02 DIAGNOSIS — Z0000 Encounter for general adult medical examination without abnormal findings: Secondary | ICD-10-CM

## 2019-06-02 DIAGNOSIS — I340 Nonrheumatic mitral (valve) insufficiency: Secondary | ICD-10-CM

## 2019-06-02 DIAGNOSIS — I10 Essential (primary) hypertension: Secondary | ICD-10-CM

## 2019-06-02 LAB — TSH: TSH: 1.08 u[IU]/mL (ref 0.35–4.50)

## 2019-06-02 LAB — LIPID PANEL
Cholesterol: 177 mg/dL (ref 0–200)
HDL: 54.7 mg/dL (ref >39.00)
LDL Cholesterol: 105 mg/dL — ABNORMAL HIGH (ref 0–99)
NonHDL: 122.12
Total CHOL/HDL Ratio: 3
Triglycerides: 85 mg/dL (ref 0.0–149.0)
VLDL: 17 mg/dL (ref 0.0–40.0)

## 2019-06-02 LAB — COMPREHENSIVE METABOLIC PANEL
ALT: 17 U/L (ref 0–35)
AST: 14 U/L (ref 0–37)
Albumin: 4 g/dL (ref 3.5–5.2)
Alkaline Phosphatase: 72 U/L (ref 39–117)
BUN: 11 mg/dL (ref 6–23)
CO2: 27 mEq/L (ref 19–32)
Calcium: 9.2 mg/dL (ref 8.4–10.5)
Chloride: 105 mEq/L (ref 96–112)
Creatinine, Ser: 0.78 mg/dL (ref 0.40–1.20)
GFR: 82.14 mL/min (ref >60.00)
Glucose, Bld: 91 mg/dL (ref 70–99)
Potassium: 4.2 mEq/L (ref 3.5–5.1)
Sodium: 139 mEq/L (ref 135–145)
Total Bilirubin: 0.4 mg/dL (ref 0.2–1.2)
Total Protein: 7.2 g/dL (ref 6.0–8.3)

## 2019-06-02 LAB — CBC WITH DIFFERENTIAL/PLATELET
Basophils Absolute: 0.1 10*3/uL (ref 0.0–0.1)
Basophils Relative: 0.9 % (ref 0.0–3.0)
Eosinophils Absolute: 0.2 10*3/uL (ref 0.0–0.7)
Eosinophils Relative: 2.6 % (ref 0.0–5.0)
HCT: 37.9 % (ref 36.0–46.0)
Hemoglobin: 12.4 g/dL (ref 12.0–15.0)
Lymphocytes Relative: 18 % (ref 12.0–46.0)
Lymphs Abs: 1.7 10*3/uL (ref 0.7–4.0)
MCHC: 32.9 g/dL (ref 30.0–36.0)
MCV: 89.7 fl (ref 78.0–100.0)
Monocytes Absolute: 0.4 10*3/uL (ref 0.1–1.0)
Monocytes Relative: 3.8 % (ref 3.0–12.0)
Neutro Abs: 7.1 10*3/uL (ref 1.4–7.7)
Neutrophils Relative %: 74.7 % (ref 43.0–77.0)
Platelets: 273 10*3/uL (ref 150.0–400.0)
RBC: 4.22 Mil/uL (ref 3.87–5.11)
RDW: 13.9 % (ref 11.5–15.5)
WBC: 9.5 10*3/uL (ref 4.0–10.5)

## 2019-06-02 NOTE — Patient Instructions (Signed)
Please return in 12 months for your annual complete physical; please come fasting.  I will release your lab results to you on your MyChart account with further instructions. Please reply with any questions.  Today you were given your flu vaccination.   Keep an eye on your blood pressures and let me know if they become elevated.   If you have any questions or concerns, please don't hesitate to send me a message via MyChart or call the office at [phone]. Thank you for visiting with Dawn Roth today! It's our pleasure caring for you.   Preventive Care 39-39 Years Old, Female Preventive care refers to visits with your health care provider and lifestyle choices that can promote health and wellness. This includes:  A yearly physical exam. This may also be called an annual well check.  Regular dental visits and eye exams.  Immunizations.  Screening for certain conditions.  Healthy lifestyle choices, such as eating a healthy diet, getting regular exercise, not using drugs or products that contain nicotine and tobacco, and limiting alcohol use. What can I expect for my preventive care visit? Physical exam Your health care provider will check your:  Height and weight. This may be used to calculate body mass index (BMI), which tells if you are at a healthy weight.  Heart rate and blood pressure.  Skin for abnormal spots. Counseling Your health care provider may ask you questions about your:  Alcohol, tobacco, and drug use.  Emotional well-being.  Home and relationship well-being.  Sexual activity.  Eating habits.  Work and work Statistician.  Method of birth control.  Menstrual cycle.  Pregnancy history. What immunizations do I need?  Influenza (flu) vaccine  This is recommended every year. Tetanus, diphtheria, and pertussis (Tdap) vaccine  You may need a Td booster every 10 years. Varicella (chickenpox) vaccine  You may need this if you have not been vaccinated. Human  papillomavirus (HPV) vaccine  If recommended by your health care provider, you may need three doses over 6 months. Measles, mumps, and rubella (MMR) vaccine  You may need at least one dose of MMR. You may also need a second dose. Meningococcal conjugate (MenACWY) vaccine  One dose is recommended if you are age 16-21 years and a first-year college student living in a residence hall, or if you have one of several medical conditions. You may also need additional booster doses. Pneumococcal conjugate (PCV13) vaccine  You may need this if you have certain conditions and were not previously vaccinated. Pneumococcal polysaccharide (PPSV23) vaccine  You may need one or two doses if you smoke cigarettes or if you have certain conditions. Hepatitis A vaccine  You may need this if you have certain conditions or if you travel or work in places where you may be exposed to hepatitis A. Hepatitis B vaccine  You may need this if you have certain conditions or if you travel or work in places where you may be exposed to hepatitis B. Haemophilus influenzae type b (Hib) vaccine  You may need this if you have certain conditions. You may receive vaccines as individual doses or as more than one vaccine together in one shot (combination vaccines). Talk with your health care provider about the risks and benefits of combination vaccines. What tests do I need?  Blood tests  Lipid and cholesterol levels. These may be checked every 5 years starting at age 62.  Hepatitis C test.  Hepatitis B test. Screening  Diabetes screening. This is done by checking your  blood sugar (glucose) after you have not eaten for a while (fasting).  Sexually transmitted disease (STD) testing.  BRCA-related cancer screening. This may be done if you have a family history of breast, ovarian, tubal, or peritoneal cancers.  Pelvic exam and Pap test. This may be done every 3 years starting at age 16. Starting at age 45, this may be  done every 5 years if you have a Pap test in combination with an HPV test. Talk with your health care provider about your test results, treatment options, and if necessary, the need for more tests. Follow these instructions at home: Eating and drinking   Eat a diet that includes fresh fruits and vegetables, whole grains, lean protein, and low-fat dairy.  Take vitamin and mineral supplements as recommended by your health care provider.  Do not drink alcohol if: ? Your health care provider tells you not to drink. ? You are pregnant, may be pregnant, or are planning to become pregnant.  If you drink alcohol: ? Limit how much you have to 0-1 drink a day. ? Be aware of how much alcohol is in your drink. In the [place] one drink equals one 12 oz bottle of beer (355 mL), one 5 oz glass of wine (148 mL), or one 1 oz glass of hard liquor (44 mL). Lifestyle  Take daily care of your teeth and gums.  Stay active. Exercise for at least 30 minutes on 5 or more days each week.  Do not use any products that contain nicotine or tobacco, such as cigarettes, e-cigarettes, and chewing tobacco. If you need help quitting, ask your health care provider.  If you are sexually active, practice safe sex. Use a condom or other form of birth control (contraception) in order to prevent pregnancy and STIs (sexually transmitted infections). If you plan to become pregnant, see your health care provider for a preconception visit. What's next?  Visit your health care provider once a year for a well check visit.  Ask your health care provider how often you should have your eyes and teeth checked.  Stay up to date on all vaccines. This information is not intended to replace advice given to you by your health care provider. Make sure you discuss any questions you have with your health care provider. Document Released: 11/10/2001 Document Revised: 05/26/2018 Document Reviewed: 05/26/2018 Elsevier Patient Education  2020  [employer].

## 2019-06-02 NOTE — Progress Notes (Signed)
Subjective  Chief Complaint  Patient presents with  . Annual Exam    Fasting  . Hypertension    HPI: Dawn Roth is a 38 y.o. female who presents to [facility] today for a Female Wellness Visit.  She also has the concerns and/or needs as listed above in the chief complaint. These will be addressed in addition to the Health Maintenance Visit.   Wellness Visit: annual visit with health maintenance review and exam without Pap   HM: up to date. Doing well diet is fair. Rare exercise.  Chronic disease management visit and/or acute problem visit:  HTN remains well controlled on meds.   On ocps now for cycle control per gyn. Husband has had a vasectomy.   MR: asymptomatic Assessment  1. Annual physical exam   2. Essential hypertension   3. Nonrheumatic mitral valve regurgitation   4. Need for immunization against influenza      Plan  Female Wellness Visit:  Age appropriate Health Maintenance and Prevention measures were discussed with patient. Included topics are cancer screening recommendations, ways to keep healthy (see AVS) including dietary and exercise recommendations, regular eye and dental care, use of seat belts, and avoidance of moderate alcohol use and tobacco use.   BMI: discussed patient's BMI and encouraged positive lifestyle modifications to help get to or maintain a target BMI.  HM needs and immunizations were addressed and ordered. See below for orders. See HM and immunization section for updates. Flu shot today  Routine labs and screening tests ordered including cmp, cbc and lipids where appropriate.  Discussed recommendations regarding Vit D and calcium supplementation (see AVS)  Chronic disease f/u and/or acute problem visit: (deemed necessary to be done in addition to the wellness visit):  HTN :  This medical condition is well controlled. There are no signs of complications, medication side effects, or red flags. Patient is  instructed to continue the current treatment plan without change in therapies or medications.   MR: monitor. Echo next year.   Follow up: Return in about 1 year (around 06/01/2020) for follow up Hypertension, complete physical.   Orders Placed This Encounter  Procedures  . Flu Vaccine QUAD 36+ mos IM  . CBC with Differential/Platelet  . Comprehensive metabolic panel  . Lipid panel  . TSH   No orders of the defined types were placed in this encounter.     Lifestyle: Body mass index is 32.06 kg/m. Wt Readings from Last 3 Encounters:  06/02/19 181 lb (82.1 kg)  02/10/19 183 lb (83 kg)  11/10/18 184 lb (83.5 kg)    Patient Active Problem List   Diagnosis Date Noted  . 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 Essential hypertension 06/09/2013  . Mitral valve regurgitation 04/28/2013    ECHO - mild - moderate, Dr. Mauricio Po ECHO 07/2018, mild and stable.     Health Maintenance  Topic Date Due  . INFLUENZA VACCINE  04/29/2019  . PAP SMEAR-Modifier  11/26/2020  . TETANUS/TDAP  01/27/2027  . HIV Screening  Completed   Immunization History  Administered Date(s) Administered  . Hepatitis B, ped/adol 01/02/2014  . Influenza, Quadrivalent, Recombinant, Inj, Pf 09/08/2013  . Influenza, Seasonal, Injecte, Preservative Fre 06/29/2015  . Influenza,inj,Quad PF,6+ Mos 06/29/2018, 06/02/2019  . Tdap 03/28/2006, 06/29/2015   We updated and reviewed the patient's past history in detail and it is documented below. Allergies: Patient  reports current alcohol use. Past Medical History Patient  has a past medical history of Heart  murmur and Hypertension. Past Surgical History Patient  has a past surgical history that includes Cholecystectomy (09/2011); Cesarean section (N/A, 07/17/2015); and Cesarean section with bilateral tubal ligation (Bilateral, 11/17/2017). Social History   Socioeconomic History  . Marital status: Married    Spouse name: Not on file  . Number of children: 2  . Years of  education: Not on file  . Highest education level: Not on file  Occupational History  . Occupation: DENTAL ASSISTANCE    Employer: [employer]  Social Needs  . Financial resource strain: Not on file  . Food insecurity    Worry: Not on file    Inability: Not on file  . Transportation needs    Medical: Not on file    Non-medical: Not on file  Tobacco Use  . Smoking status: Never Smoker  . Smokeless tobacco: Never Used  Substance and Sexual Activity  . Alcohol use: Yes  . Drug use: No  . Sexual activity: Yes    Birth control/protection: Surgical  Lifestyle  . Physical activity    Days per week: Not on file    Minutes per session: Not on file  . Stress: Not on file  Relationships  . Social Herbalist on phone: Not on file    Gets together: Not on file    Attends religious service: Not on file    Active member of club or organization: Not on file    Attends meetings of clubs or organizations: Not on file    Relationship status: Not on file  Other Topics Concern  . Not on file  Social History Narrative  . Not on file   Family History  Problem Relation Age of Onset  . Hypertension Father   . Alcohol abuse Father   . Arthritis Father   . Depression Sister   . Anxiety disorder Sister   . Heart disease Paternal Aunt   . Liver cancer Maternal Grandmother   . Brain cancer Paternal Grandmother   . Heart attack Paternal Grandfather   . Healthy Son   . Healthy Son     Review of Systems: Constitutional: negative for fever or malaise Ophthalmic: negative for photophobia, double vision or loss of vision Cardiovascular: negative for chest pain, dyspnea on exertion, or new LE swelling Respiratory: negative for SOB or persistent cough Gastrointestinal: negative for abdominal pain, change in bowel habits or melena Genitourinary: negative for dysuria or gross hematuria, no abnormal uterine bleeding or disharge Musculoskeletal: negative for new gait  disturbance or muscular weakness Integumentary: negative for new or persistent rashes, no breast lumps Neurological: negative for TIA or stroke symptoms Psychiatric: negative for SI or delusions Allergic/Immunologic: negative for hives  Patient Care Team    Relationship Specialty Notifications Start End  Leamon Arnt, MD PCP - General Family Medicine  02/04/18     Objective  Vitals: BP 124/76   Pulse 85   Temp 97.8 F (36.6 C) (Tympanic)   Resp 16   Ht 5\' 3"  (1.6 m)   Wt 181 lb (82.1 kg)   LMP 05/18/2019   SpO2 98%   BMI 32.06 kg/m  General:  Well developed, well nourished, no acute distress  Psych:  Alert and orientedx3,normal mood and affect HEENT:  Normocephalic, atraumatic, non-icteric sclera, PERRL, oropharynx is clear without mass or exudate, supple neck without adenopathy, mass or thyromegaly Cardiovascular:  Normal S1, fixed splitS2, RRR without gallop, rub, 2/6 systolic murmur, nondisplaced PMI Respiratory:  Good breath sounds  bilaterally, CTAB with normal respiratory effort Gastrointestinal: normal bowel sounds, soft, non-tender, no noted masses. No HSM MSK: no deformities, contusions. Joints are without erythema or swelling. Spine and CVA region are nontender Skin:  Warm, no rashes or suspicious lesions noted Neurologic:    Mental status is normal. CN 2-11 are normal. Gross motor and sensory exams are normal. Normal gait. No tremor     Commons side effects, risks, benefits, and alternatives for medications and treatment plan prescribed today were discussed, and the patient expressed understanding of the given instructions. Patient is instructed to call or message via MyChart if he/she has any questions or concerns regarding our treatment plan. No barriers to understanding were identified. We discussed Red Flag symptoms and signs in detail. Patient expressed understanding regarding what to do in case of urgent or emergency type symptoms.   Medication list was  reconciled, printed and provided to the patient in AVS. Patient instructions and summary information was reviewed with the patient as documented in the AVS. This note was prepared with assistance of Dragon voice recognition software. Occasional wrong-word or sound-a-like substitutions may have occurred due to the inherent limitations of voice recognition software

## 2019-08-22 ENCOUNTER — Encounter: Payer: Self-pay | Admitting: Family Medicine

## 2019-08-22 ENCOUNTER — Other Ambulatory Visit: Payer: Self-pay

## 2019-08-22 MED ORDER — LABETALOL HCL 100 MG PO TABS: 100.0000 mg | ORAL_TABLET | Freq: Two times a day (BID) | ORAL | 3 refills | Status: DC

## 2019-09-11 ENCOUNTER — Other Ambulatory Visit: Payer: Self-pay

## 2019-09-11 ENCOUNTER — Ambulatory Visit (INDEPENDENT_AMBULATORY_CARE_PROVIDER_SITE_OTHER): Payer: 59 | Admitting: Family Medicine

## 2019-09-11 ENCOUNTER — Encounter: Payer: Self-pay | Admitting: Family Medicine

## 2019-09-11 VITALS — BP 124/78 | HR 94 | Temp 98.0°F | Ht 63.0 in | Wt 179.4 lb

## 2019-09-11 DIAGNOSIS — Z3041 Encounter for surveillance of contraceptive pills: Secondary | ICD-10-CM | POA: Diagnosis not present

## 2019-09-11 DIAGNOSIS — I10 Essential (primary) hypertension: Secondary | ICD-10-CM | POA: Diagnosis not present

## 2019-09-11 DIAGNOSIS — R5383 Other fatigue: Secondary | ICD-10-CM

## 2019-09-11 LAB — CBC WITH DIFFERENTIAL/PLATELET
Basophils Absolute: 0.1 10*3/uL (ref 0.0–0.1)
Basophils Relative: 0.7 % (ref 0.0–3.0)
Eosinophils Absolute: 0.1 10*3/uL (ref 0.0–0.7)
Eosinophils Relative: 1.5 % (ref 0.0–5.0)
HCT: 41.7 % (ref 36.0–46.0)
Hemoglobin: 13.8 g/dL (ref 12.0–15.0)
Lymphocytes Relative: 17.8 % (ref 12.0–46.0)
Lymphs Abs: 1.5 10*3/uL (ref 0.7–4.0)
MCHC: 33.2 g/dL (ref 30.0–36.0)
MCV: 89.4 fl (ref 78.0–100.0)
Monocytes Absolute: 0.3 10*3/uL (ref 0.1–1.0)
Monocytes Relative: 3.7 % (ref 3.0–12.0)
Neutro Abs: 6.6 10*3/uL (ref 1.4–7.7)
Neutrophils Relative %: 76.3 % (ref 43.0–77.0)
Platelets: 331 10*3/uL (ref 150.0–400.0)
RBC: 4.66 Mil/uL (ref 3.87–5.11)
RDW: 13.6 % (ref 11.5–15.5)
WBC: 8.7 10*3/uL (ref 4.0–10.5)

## 2019-09-11 LAB — BASIC METABOLIC PANEL
BUN: 12 mg/dL (ref 6–23)
CO2: 27 mEq/L (ref 19–32)
Calcium: 9.8 mg/dL (ref 8.4–10.5)
Chloride: 104 mEq/L (ref 96–112)
Creatinine, Ser: 0.75 mg/dL (ref 0.40–1.20)
GFR: 85.82 mL/min (ref >60.00)
Glucose, Bld: 106 mg/dL — ABNORMAL HIGH (ref 70–99)
Potassium: 4.3 mEq/L (ref 3.5–5.1)
Sodium: 140 mEq/L (ref 135–145)

## 2019-09-11 LAB — TSH: TSH: 0.87 u[IU]/mL (ref 0.35–4.50)

## 2019-09-11 NOTE — Progress Notes (Signed)
Subjective  CC:  Chief Complaint  Patient presents with  . Hypertension    HPI: Dawn Roth is a 38 y.o. female who presents to the office today to address the problems listed above in the chief complaint.  Hypertension f/u: Control is variable. . Pt reports she is doing well. taking medications as instructed, no medication side effects noted, no TIAs, no chest pain on exertion, no dyspnea on exertion, no swelling of ankles. However, over last week, felt "sluggish and off" so checked her BP and it was elevated 150/90. After sitting and "calming down", the rechecked bp normalized. This has happened several times over the last week, although at times when she checks it is normal. She denies cp, sob, doe, leg swelling or persistent palpitations. She does have mild MR w/o prolapse. She admits to it being a stressful time with covid, full time work and parent of 2 small children during an atypical holiday season but not certain if her stress/anxiety is affecting her. No panic attacks.  She denies adverse effects from his BP medications. Compliance with medication is good. Of note, she is back on ocps to regulate cycles.  Assessment  1. Essential hypertension   2. Fatigue, unspecified type   3. Oral contraceptive use      Plan    Hypertension f/u: BP control is well controlled. Suspect htn response due to stress/worry. Will check labs, counseling done, pt to continue to monitor at home/work and pay particular attention to stress levels and mood. If persists, she will return for further analysis. Discussed ocp could be elevated bp a little as well. No med adjustment today.  Fatigue f/u: check labs. Could be due to demands of work and home life. Will monitor. She denies sxs of depression now. Education regarding management of these chronic disease states was given. Management strategies discussed on successive visits include dietary and exercise recommendations, goals of achieving and  maintaining IBW, and lifestyle modifications aiming for adequate sleep and minimizing stressors.   Follow up: Return if symptoms worsen or fail to improve.  Orders Placed This Encounter  Procedures  . CBC w/Diff  . TSH  . BMP   No orders of the defined types were placed in this encounter.     BP Readings from Last 3 Encounters:  09/11/19 124/78  06/02/19 124/76  02/10/19 108/71   Wt Readings from Last 3 Encounters:  09/11/19 179 lb 6.4 oz (81.4 kg)  06/02/19 181 lb (82.1 kg)  02/10/19 183 lb (83 kg)    Lab Results  Component Value Date   CHOL 177 06/02/2019   CHOL 190 02/04/2018   Lab Results  Component Value Date   HDL 54.70 06/02/2019   HDL 61.00 02/04/2018   Lab Results  Component Value Date   LDLCALC 105 (H) 06/02/2019   LDLCALC 117 (H) 02/04/2018   Lab Results  Component Value Date   TRIG 85.0 06/02/2019   TRIG 62.0 02/04/2018   Lab Results  Component Value Date   CHOLHDL 3 06/02/2019   CHOLHDL 3 02/04/2018   No results found for: LDLDIRECT Lab Results  Component Value Date   CREATININE 0.78 06/02/2019   BUN 11 06/02/2019   NA 139 06/02/2019   K 4.2 06/02/2019   CL 105 06/02/2019   CO2 27 06/02/2019    The ASCVD Risk score (Goff DC Jr., et al., 2013) failed to calculate for the following reasons:   The 2013 ASCVD risk score is only valid for ages  40 to 48  I reviewed the patients updated PMH, FH, and SocHx.    Patient Active Problem List   Diagnosis Date Noted  . 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 Oral contraceptive use 11/15/2015  . Essential hypertension 06/09/2013  . Mitral valve regurgitation 04/28/2013    Allergies: Patient has no known allergies.  Social History: Patient  reports that she has never smoked. She has never used smokeless tobacco. She reports current alcohol use. She reports that she does not use drugs.  Current Meds  Medication Sig  . labetalol (NORMODYNE) 100 MG tablet Take 1 tablet (100 mg total) by mouth 2 (two)  times daily.  . Multiple Vitamins-Minerals (MULTIVITAMIN ADULT PO) multivitamin  . norethindrone-ethinyl estradiol (JUNEL FE 1/20) 1-20 MG-MCG tablet Junel Fe 24 1 mg-20 mcg (24)/75 mg (4) tablet  TAKE ONE TABLET BY MOUTH EVERY DAY    Review of Systems: Cardiovascular: negative for chest pain, palpitations, leg swelling, orthopnea Respiratory: negative for SOB, wheezing or persistent cough Gastrointestinal: negative for abdominal pain Genitourinary: negative for dysuria or gross hematuria  Objective  Vitals: BP 124/78 (BP Location: Left Arm, Patient Position: Sitting, Cuff Size: Normal)   Pulse 94   Temp 98 F (36.7 C) (Temporal)   Ht 5\' 3"  (1.6 m)   Wt 179 lb 6.4 oz (81.4 kg)   SpO2 99%   BMI 31.78 kg/m  General: no acute distress  Psych:  Alert and oriented, normal mood and affect HEENT:  Normocephalic, atraumatic, supple neck  Cardiovascular:  RRR without murmur. no edema Respiratory:  Good breath sounds bilaterally, CTAB with normal respiratory effort Skin:  Warm, no rashes Neurologic:   Mental status is normal, no tremor  Commons side effects, risks, benefits, and alternatives for medications and treatment plan prescribed today were discussed, and the patient expressed understanding of the given instructions. Patient is instructed to call or message via MyChart if he/she has any questions or concerns regarding our treatment plan. No barriers to understanding were identified. We discussed Red Flag symptoms and signs in detail. Patient expressed understanding regarding what to do in case of urgent or emergency type symptoms.   Medication list was reconciled, printed and provided to the patient in AVS. Patient instructions and summary information was reviewed with the patient as documented in the AVS. This note was prepared with assistance of Dragon voice recognition software. Occasional wrong-word or sound-a-like substitutions may have occurred due to the inherent limitations of  voice recognition software  This visit occurred during the SARS-CoV-2 public health emergency.  Safety protocols were in place, including screening questions prior to the visit, additional usage of staff PPE, and extensive cleaning of exam room while observing appropriate contact time as indicated for disinfecting solutions.

## 2019-09-11 NOTE — Patient Instructions (Signed)
Please return if not feeling better.   I will release your lab results to you on your MyChart account with further instructions. Please reply with any questions.    If you have any questions or concerns, please don't hesitate to send me a message via MyChart or call the office at [phone]. Thank you for visiting with [place] today! It's our pleasure caring for you.

## 2019-10-11 ENCOUNTER — Encounter: Payer: Self-pay | Admitting: Family Medicine

## 2019-10-12 ENCOUNTER — Ambulatory Visit (INDEPENDENT_AMBULATORY_CARE_PROVIDER_SITE_OTHER): Payer: 59 | Admitting: Family Medicine

## 2019-10-12 ENCOUNTER — Encounter: Payer: Self-pay | Admitting: Family Medicine

## 2019-10-12 VITALS — Temp 99.5°F | Ht 63.0 in | Wt 179.0 lb

## 2019-10-12 DIAGNOSIS — J0100 Acute maxillary sinusitis, unspecified: Secondary | ICD-10-CM | POA: Diagnosis not present

## 2019-10-12 MED ORDER — AMOXICILLIN-POT CLAVULANATE 875-125 MG PO TABS: 1.0000 | ORAL_TABLET | Freq: Two times a day (BID) | ORAL | 0 refills | Status: DC

## 2019-10-12 NOTE — Progress Notes (Signed)
Virtual Visit via Video Note  Subjective  CC:  Chief Complaint  Patient presents with  . Sinusitis    started about a week ago     I connected with Sung Amabile on 10/12/19 at  8:40 AM EST by a video enabled telemedicine application and verified that I am speaking with the correct person using two identifiers. Location patient: Home Location provider: [facility] [place], Office Persons participating in the virtual visit: Yamilet Saucer, Leamon Arnt, MD Serita Sheller, [place] discussed the limitations of evaluation and management by telemedicine and the availability of in person appointments. The patient expressed understanding and agreed to proceed. HPI: Dawn Roth is a 39 y.o. female who was contacted today to address the problems listed above in the chief complaint. . Almost 1 week of sinus congestion and pain with referred pain to her teeth.  Some frontal pressure.  Some low-grade fevers.  Denies cough, shortness of breath, myalgias, loss of taste or smell, GI symptoms or exposures to Covid.  She does work in a [employer].  She has had sinus infections in the past and this feels the same.  No atypical symptoms.  She is used over-the-counter Flonase and Advil with mild relief Assessment  1. Acute non-recurrent maxillary sinusitis      Plan   Acute sinusitis: Symptoms most consistent with bacterial sinusitis, but did educate and counsel on overlapping symptoms that are associated with Covid.  If she has any concern regarding this she should get tested.  She will let me know.  We will treat with Augmentin for a week, Flonase and Advil.  Follow-up if unimproved I discussed the assessment and treatment plan with the patient. The patient was provided an opportunity to ask questions and all were answered. The patient agreed with the plan and demonstrated an understanding of the instructions.   The patient was advised to call back or seek an in-person  evaluation if the symptoms worsen or if the condition fails to improve as anticipated. Follow up: No follow-ups on file.  Visit date not found  Meds ordered this encounter  Medications  . amoxicillin-clavulanate (AUGMENTIN) 875-125 MG tablet    Sig: Take 1 tablet by mouth 2 (two) times daily.    Dispense:  14 tablet    Refill:  0      I reviewed the patients updated PMH, FH, and SocHx.    Patient Active Problem List   Diagnosis Date Noted  . 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 Oral contraceptive use 11/15/2015  . Essential hypertension 06/09/2013  . Mitral valve regurgitation 04/28/2013   Current Meds  Medication Sig  . labetalol (NORMODYNE) 100 MG tablet Take 1 tablet (100 mg total) by mouth 2 (two) times daily.  . Multiple Vitamins-Minerals (MULTIVITAMIN ADULT PO) multivitamin  . norethindrone-ethinyl estradiol (JUNEL FE 1/20) 1-20 MG-MCG tablet Junel Fe 24 1 mg-20 mcg (24)/75 mg (4) tablet  TAKE ONE TABLET BY MOUTH EVERY DAY    Allergies: Patient has No Known Allergies. Family History: Patient family history includes Alcohol abuse in her father; Anxiety disorder in her sister; Arthritis in her father; Brain cancer in her paternal grandmother; Depression in her sister; Healthy in her son and son; Heart attack in her paternal grandfather; Heart disease in her paternal aunt; Hypertension in her father; Liver cancer in her maternal grandmother. Social History:  Patient  reports that she has never smoked. She has never used smokeless tobacco. She reports current  alcohol use. She reports that she does not use drugs.  Review of Systems: Constitutional: Negative for fever malaise or anorexia Cardiovascular: negative for chest pain Respiratory: negative for SOB or persistent cough Gastrointestinal: negative for abdominal pain  OBJECTIVE Vitals: Temp 99.5 F (37.5 C) (Temporal)   Ht 5\' 3"  (1.6 m)   Wt 179 lb (81.2 kg)   BMI 31.71 kg/m  General: no acute distress ,  A&Ox3 Congested  Leamon Arnt, MD

## 2019-10-17 ENCOUNTER — Encounter: Payer: Self-pay | Admitting: Family Medicine

## 2020-04-17 ENCOUNTER — Other Ambulatory Visit: Payer: Self-pay | Admitting: Obstetrics and Gynecology

## 2020-04-17 DIAGNOSIS — R928 Other abnormal and inconclusive findings on diagnostic imaging of breast: Secondary | ICD-10-CM

## 2020-04-26 ENCOUNTER — Ambulatory Visit
Admission: RE | Admit: 2020-04-26 | Discharge: 2020-04-26 | Disposition: A | Discharge status: Home / Self Care | Payer: 59 | Source: Ambulatory Visit | Attending: Obstetrics and Gynecology | Admitting: Obstetrics and Gynecology

## 2020-04-26 ENCOUNTER — Other Ambulatory Visit: Payer: Self-pay

## 2020-04-26 DIAGNOSIS — R928 Other abnormal and inconclusive findings on diagnostic imaging of breast: Secondary | ICD-10-CM

## 2020-06-07 ENCOUNTER — Encounter: Payer: Self-pay | Admitting: Family Medicine

## 2020-06-07 ENCOUNTER — Other Ambulatory Visit: Payer: Self-pay

## 2020-06-07 ENCOUNTER — Ambulatory Visit (INDEPENDENT_AMBULATORY_CARE_PROVIDER_SITE_OTHER): Payer: 59 | Admitting: Family Medicine

## 2020-06-07 VITALS — BP 118/84 | HR 84 | Temp 98.7°F | Resp 18 | Ht 63.0 in | Wt 180.8 lb

## 2020-06-07 DIAGNOSIS — Z3041 Encounter for surveillance of contraceptive pills: Secondary | ICD-10-CM

## 2020-06-07 DIAGNOSIS — I10 Essential (primary) hypertension: Secondary | ICD-10-CM | POA: Diagnosis not present

## 2020-06-07 DIAGNOSIS — I340 Nonrheumatic mitral (valve) insufficiency: Secondary | ICD-10-CM

## 2020-06-07 DIAGNOSIS — Z0000 Encounter for general adult medical examination without abnormal findings: Secondary | ICD-10-CM | POA: Diagnosis not present

## 2020-06-07 DIAGNOSIS — R202 Paresthesia of skin: Secondary | ICD-10-CM

## 2020-06-07 DIAGNOSIS — Z23 Encounter for immunization: Secondary | ICD-10-CM

## 2020-06-07 DIAGNOSIS — F430 Acute stress reaction: Secondary | ICD-10-CM

## 2020-06-07 MED ORDER — ESCITALOPRAM OXALATE 10 MG PO TABS: 10.0000 mg | ORAL_TABLET | Freq: Every day | ORAL | 2 refills | Status: DC

## 2020-06-07 NOTE — Addendum Note (Signed)
Addended by: Thomes Cake on: 06/07/2020 11:10 AM   Modules accepted: Orders

## 2020-06-07 NOTE — Progress Notes (Signed)
Subjective  Chief Complaint  Patient presents with  . Annual Exam    Fasting labs  . Health Maintenance    Flu shot in office today.    HPI: Dawn Roth is a 39 y.o. female who presents to [facility] today for a Female Wellness Visit.  She also has the concerns and/or needs as listed above in the chief complaint. These will be addressed in addition to the Health Maintenance Visit.   Wellness Visit: annual visit with health maintenance review and exam without Pap   HM: up to date. On ocps and stable (cycle control)  Chronic disease management visit and/or acute problem visit:   HTN: well controlled. Feeling well. Taking medications w/o adverse effects. No symptoms of CHF, angina; no palpitations, sob, cp or lower extremity edema. Compliant with meds.   Follow-up stress reaction: Last visit in December 2020 patient was experiencing panic symptoms and stress reaction.  She says things are better however they are not resolved.  Continues to have high stress at work.  Symptoms include numbness tingling in the fingertips, palpitations, flushing and feelings of dread.  She denies symptoms of depression.  She does better on the weekends when she is home.  Busy life with 2 young children at home.  Has never had generalized anxiety or depression.  Affects her quality of life negatively.  Hoping to be able to change jobs at the end of this year.  Complains of very intermittent mild paresthesias in both feet.  Not painful.  No foot lesions.   Assessment  1. Annual physical exam   2. Essential hypertension   3. Oral contraceptive use   4. Nonrheumatic mitral valve regurgitation   5. Stress reaction   6. Paresthesia of both feet      Plan  Female Wellness Visit:  Age appropriate Health Maintenance and Prevention measures were discussed with patient. Included topics are cancer screening recommendations, ways to keep healthy (see AVS) including dietary and  exercise recommendations, regular eye and dental care, use of seat belts, and avoidance of moderate alcohol use and tobacco use.   BMI: discussed patient's BMI and encouraged positive lifestyle modifications to help get to or maintain a target BMI.  HM needs and immunizations were addressed and ordered. See below for orders. See HM and immunization section for updates.  Flu shot today  Routine labs and screening tests ordered including cmp, cbc and lipids where appropriate.  Discussed recommendations regarding Vit D and calcium supplementation (see AVS)  Chronic disease f/u and/or acute problem visit: (deemed necessary to be done in addition to the wellness visit):  Hypertension: This medical condition is well controlled. There are no signs of complications, medication side effects, or red flags. Patient is instructed to continue the current treatment plan without change in therapies or medications.  Stress reaction/anxiety: Counseling done.  Start Lexapro 10 daily.  Discussed risk, benefits and expectations.  OCPs for cycle control  Mild mitral regurg with last echo 2019.  No new symptoms.  Defer echo.  Paresthesias: Check vitamin levels and thyroid.  Start vitamin B complex.  Monitor.  Follow up: Return in about 6 weeks (around 07/19/2020) for mood follow up.   Orders Placed This Encounter  Procedures  . CBC with Differential/Platelet  . COMPLETE METABOLIC PANEL WITH GFR  . Lipid panel  . TSH  . Vitamin B12  . VITAMIN D 25 Hydroxy (Vit-D Deficiency, Fractures)   Meds ordered this encounter  Medications  .  escitalopram (LEXAPRO) 10 MG tablet    Sig: Take 1 tablet (10 mg total) by mouth daily.    Dispense:  30 tablet    Refill:  2      Lifestyle: Body mass index is 32.03 kg/m. Wt Readings from Last 3 Encounters:  06/07/20 180 lb 12.8 oz (82 kg)  10/12/19 179 lb (81.2 kg)  09/11/19 179 lb 6.4 oz (81.4 kg)     Patient Active Problem List   Diagnosis Date Noted  .  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 Oral contraceptive use 11/15/2015  . Essential hypertension 06/09/2013  . Mitral valve regurgitation 04/28/2013    ECHO - mild - moderate, Dr. Mauricio Po ECHO 07/2018, mild and stable.     Health Maintenance  Topic Date Due  . Hepatitis C Screening  Never done  . INFLUENZA VACCINE  04/28/2020  . PAP SMEAR-Modifier  11/26/2020  . TETANUS/TDAP  01/27/2027  . COVID-19 Vaccine  Completed  . HIV Screening  Completed   Immunization History  Administered Date(s) Administered  . Hepatitis B, ped/adol 01/02/2014  . Influenza, Quadrivalent, Recombinant, Inj, Pf 09/08/2013  . Influenza, Seasonal, Injecte, Preservative Fre 06/29/2015  . Influenza,inj,Quad PF,6+ Mos 06/29/2018, 06/02/2019  . Moderna SARS-COVID-2 Vaccination 12/30/2019, 01/20/2020  . Tdap 03/28/2006, 06/29/2015   We updated and reviewed the patient's past history in detail and it is documented below. Allergies: Patient  reports current alcohol use. Past Medical History Patient  has a past medical history of Heart murmur and Hypertension. Past Surgical History Patient  has a past surgical history that includes Cholecystectomy (09/2011); Cesarean section (N/A, 07/17/2015); and Cesarean section with bilateral tubal ligation (Bilateral, 11/17/2017). Social History   Socioeconomic History  . Marital status: Married    Spouse name: Not on file  . Number of children: 2  . Years of education: Not on file  . Highest education level: Not on file  Occupational History  . Occupation: DENTAL ASSISTANCE    Employer: [employer]  Tobacco Use  . Smoking status: Never Smoker  . Smokeless tobacco: Never Used  Vaping Use  . Vaping Use: Never used  Substance and Sexual Activity  . Alcohol use: Yes  . Drug use: No  . Sexual activity: Yes    Birth control/protection: Surgical  Other Topics Concern  . Not on file  Social History Narrative  . Not on file   Social Determinants of Health    Financial Resource Strain:   . Difficulty of Paying Living Expenses: Not on file  Food Insecurity:   . Worried About Charity fundraiser in the Last Year: Not on file  . Ran Out of Food in the Last Year: Not on file  Transportation Needs:   . Lack of Transportation (Medical): Not on file  . Lack of Transportation (Non-Medical): Not on file  Physical Activity:   . Days of Exercise per Week: Not on file  . Minutes of Exercise per Session: Not on file  Stress:   . Feeling of Stress : Not on file  Social Connections:   . Frequency of Communication with Friends and Family: Not on file  . Frequency of Social Gatherings with Friends and Family: Not on file  . Attends Religious Services: Not on file  . Active Member of Clubs or Organizations: Not on file  . Attends Archivist Meetings: Not on file  . Marital Status: Not on file   Family History  Problem Relation Age of Onset  . Hypertension Father   .  Alcohol abuse Father   . Arthritis Father   . Depression Sister   . Anxiety disorder Sister   . Heart disease Paternal Aunt   . Liver cancer Maternal Grandmother   . Brain cancer Paternal Grandmother   . Heart attack Paternal Grandfather   . Healthy Son   . Healthy Son     Review of Systems: Constitutional: negative for fever or malaise Ophthalmic: negative for photophobia, double vision or loss of vision Cardiovascular: negative for chest pain, dyspnea on exertion, or new LE swelling Respiratory: negative for SOB or persistent cough Gastrointestinal: negative for abdominal pain, change in bowel habits or melena Genitourinary: negative for dysuria or gross hematuria, no abnormal uterine bleeding or disharge Musculoskeletal: negative for new gait disturbance or muscular weakness Integumentary: negative for new or persistent rashes, no breast lumps Neurological: negative for TIA or stroke symptoms Psychiatric: negative for SI or delusions Allergic/Immunologic: negative  for hives  Patient Care Team    Relationship Specialty Notifications Start End  Leamon Arnt, MD PCP - General Family Medicine  02/04/18     Objective  Vitals: BP 118/84   Pulse 84   Temp 98.7 F (37.1 C) (Temporal)   Resp 18   Ht 5\' 3"  (1.6 m)   Wt 180 lb 12.8 oz (82 kg)   SpO2 98%   BMI 32.03 kg/m  General:  Well developed, well nourished, no acute distress  Psych:  Alert and orientedx3,normal mood and affect HEENT:  Normocephalic, atraumatic, non-icteric sclera, PERRL, supple neck without adenopathy, mass or thyromegaly Cardiovascular:  Normal S1, S2, RRR without gallop, rub or murmur Respiratory:  Good breath sounds bilaterally, CTAB with normal respiratory effort, normal distal pulses Gastrointestinal: normal bowel sounds, soft, non-tender, no noted masses. No HSM MSK: no deformities, contusions. Joints are without erythema or swelling.  Skin:  Warm, no rashes or suspicious lesions noted Neurologic:    Mental status is normal. Gross motor and sensory exams are normal. Normal gait. No tremor     Commons side effects, risks, benefits, and alternatives for medications and treatment plan prescribed today were discussed, and the patient expressed understanding of the given instructions. Patient is instructed to call or message via MyChart if he/she has any questions or concerns regarding our treatment plan. No barriers to understanding were identified. We discussed Red Flag symptoms and signs in detail. Patient expressed understanding regarding what to do in case of urgent or emergency type symptoms.   Medication list was reconciled, printed and provided to the patient in AVS. Patient instructions and summary information was reviewed with the patient as documented in the AVS. This note was prepared with assistance of Dragon voice recognition software. Occasional wrong-word or sound-a-like substitutions may have occurred due to the inherent limitations of voice recognition  software  This visit occurred during the SARS-CoV-2 public health emergency.  Safety protocols were in place, including screening questions prior to the visit, additional usage of staff PPE, and extensive cleaning of exam room while observing appropriate contact time as indicated for disinfecting solutions.

## 2020-06-07 NOTE — Patient Instructions (Signed)
Please return in 6-8 weeks to recheck mood.   I will release your lab results to you on your MyChart account with further instructions. Please reply with any questions.   Start taking a Bcomplex vitamin daily.  If you have any questions or concerns, please don't hesitate to send me a message via MyChart or call the office at [phone]. Thank you for visiting with [place] today! It's our pleasure caring for you.

## 2020-06-08 LAB — CBC WITH DIFFERENTIAL/PLATELET
Absolute Monocytes: 294 cells/uL (ref 200–950)
Basophils Absolute: 51 cells/uL (ref 0–200)
Basophils Relative: 0.8 %
Eosinophils Absolute: 128 cells/uL (ref 15–500)
Eosinophils Relative: 2 %
HCT: 40.7 % (ref 35.0–45.0)
Hemoglobin: 13.1 g/dL (ref 11.7–15.5)
Lymphs Abs: 1658 cells/uL (ref 850–3900)
MCH: 29.3 pg (ref 27.0–33.0)
MCHC: 32.2 g/dL (ref 32.0–36.0)
MCV: 91.1 fL (ref 80.0–100.0)
MPV: 11.2 fL (ref 7.5–12.5)
Monocytes Relative: 4.6 %
Neutro Abs: 4269 cells/uL (ref 1500–7800)
Neutrophils Relative %: 66.7 %
Platelets: 289 10*3/uL (ref 140–400)
RBC: 4.47 10*6/uL (ref 3.80–5.10)
RDW: 12.9 % (ref 11.0–15.0)
Total Lymphocyte: 25.9 %
WBC: 6.4 10*3/uL (ref 3.8–10.8)

## 2020-06-08 LAB — LIPID PANEL
Cholesterol: 222 mg/dL — ABNORMAL HIGH (ref <200)
HDL: 71 mg/dL (ref >=50)
LDL Cholesterol (Calc): 130 mg/dL (calc) — ABNORMAL HIGH
Non-HDL Cholesterol (Calc): 151 mg/dL (calc) — ABNORMAL HIGH (ref <130)
Total CHOL/HDL Ratio: 3.1 (calc) (ref <5.0)
Triglycerides: 107 mg/dL (ref <150)

## 2020-06-08 LAB — COMPLETE METABOLIC PANEL WITH GFR
AG Ratio: 1.4 (calc) (ref 1.0–2.5)
ALT: 36 U/L — ABNORMAL HIGH (ref 6–29)
AST: 20 U/L (ref 10–30)
Albumin: 4.4 g/dL (ref 3.6–5.1)
Alkaline phosphatase (APISO): 73 U/L (ref 31–125)
BUN: 11 mg/dL (ref 7–25)
CO2: 26 mmol/L (ref 20–32)
Calcium: 9.5 mg/dL (ref 8.6–10.2)
Chloride: 104 mmol/L (ref 98–110)
Creat: 0.76 mg/dL (ref 0.50–1.10)
GFR, Est African American: 114 mL/min/{1.73_m2} (ref >=60)
GFR, Est Non African American: 98 mL/min/{1.73_m2} (ref >=60)
Globulin: 3.2 g/dL (calc) (ref 1.9–3.7)
Glucose, Bld: 90 mg/dL (ref 65–99)
Potassium: 4.3 mmol/L (ref 3.5–5.3)
Sodium: 140 mmol/L (ref 135–146)
Total Bilirubin: 0.5 mg/dL (ref 0.2–1.2)
Total Protein: 7.6 g/dL (ref 6.1–8.1)

## 2020-06-08 LAB — VITAMIN D 25 HYDROXY (VIT D DEFICIENCY, FRACTURES): Vit D, 25-Hydroxy: 55 ng/mL (ref 30–100)

## 2020-06-08 LAB — TSH: TSH: 1.75 mIU/L

## 2020-06-08 LAB — VITAMIN B12: Vitamin B-12: 400 pg/mL (ref 200–1100)

## 2020-07-22 ENCOUNTER — Other Ambulatory Visit: Payer: Self-pay | Admitting: Family Medicine

## 2020-07-26 ENCOUNTER — Encounter: Payer: Self-pay | Admitting: Family Medicine

## 2020-07-26 ENCOUNTER — Ambulatory Visit: Payer: 59 | Admitting: Family Medicine

## 2020-07-26 ENCOUNTER — Other Ambulatory Visit: Payer: Self-pay

## 2020-07-26 VITALS — BP 130/80 | HR 92 | Temp 98.7°F | Ht 63.0 in | Wt 177.6 lb

## 2020-07-26 DIAGNOSIS — F430 Acute stress reaction: Secondary | ICD-10-CM

## 2020-07-26 DIAGNOSIS — F4322 Adjustment disorder with anxiety: Secondary | ICD-10-CM | POA: Insufficient documentation

## 2020-07-26 MED ORDER — ESCITALOPRAM OXALATE 10 MG PO TABS
10.0000 mg | ORAL_TABLET | Freq: Every day | ORAL | 3 refills | Status: DC
Start: 2020-07-26 — End: 2021-06-23

## 2020-07-26 NOTE — Patient Instructions (Signed)
Please return in 5 months for recheck mood and blood pressure.   If you have any questions or concerns, please don't hesitate to send me a message via MyChart or call the office at [phone]. Thank you for visiting with [place] today! It's our pleasure caring for you.

## 2020-07-26 NOTE — Progress Notes (Signed)
Subjective    CC:  Chief Complaint  Patient presents with  . Anxiety    started Lexapro 10 mg at last visit, "I feel quite a bit better"     HPI: Dawn Roth is a 39 y.o. female who presents to the office today to address the problems listed above in the chief complaint, mood problems.  See last note. Doing much better. No more panic attacks. Rare anxiety symptoms. Less irritable at home and at work. Husband has voiced noting improvement as well. Feels happier. No AEs on lexapro x 6 weeks.    Depression screen [facility] 2/9 07/26/2020 06/07/2020 06/02/2019  Decreased Interest 0 0 0  Down, Depressed, Hopeless 1 0 0  PHQ - 2 Score 1 0 0  Altered sleeping 0 - -  Tired, decreased energy 1 - -  Change in appetite 0 - -  Feeling bad or failure about yourself  0 - -  Trouble concentrating 1 - -  Moving slowly or fidgety/restless 0 - -  Suicidal thoughts 0 - -  PHQ-9 Score 3 - -  Difficult doing work/chores Not difficult at all - -   GAD 7 : Generalized Anxiety Score 07/26/2020  Nervous, Anxious, on Edge 0  Control/stop worrying 0  Worry too much - different things 0  Trouble relaxing 0  Restless 0  Easily annoyed or irritable 1  Afraid - awful might happen 0  Total GAD 7 Score 1  Anxiety Difficulty Not difficult at all    Assessment  1. Stress reaction      Plan   Stress reaction:  Improved. Continue 10 lexapro daily and recheck 5-6 months.   Reviewed concept of mood problems caused by biochemical imbalance of neurotransmitters and rationale for treatment with medications and therapy.   Counseling given: pt was instructed to contact office, on-call physician or crisis Hotline if symptoms worsen significantly. If patient develops any suicidal or homicidal thoughts, she is directed to the ER immediately.   Follow up: 5 months for htn and mood recheck and mild elevated AST  No orders of the defined types were placed in this encounter.  Meds ordered this encounter    Medications  . escitalopram (LEXAPRO) 10 MG tablet    Sig: Take 1 tablet (10 mg total) by mouth daily.    Dispense:  90 tablet    Refill:  3      I reviewed the patients updated PMH, FH, and SocHx.    Patient Active Problem List   Diagnosis Date Noted  . Stress reaction 07/26/2020  . 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 Oral contraceptive use 11/15/2015  . Essential hypertension 06/09/2013  . Mitral valve regurgitation 04/28/2013   Current Meds  Medication Sig  . escitalopram (LEXAPRO) 10 MG tablet Take 1 tablet (10 mg total) by mouth daily.  Marland Kitchen labetalol (NORMODYNE) 100 MG tablet Take 1 tablet (100 mg total) by mouth 2 (two) times daily.  . Multiple Vitamins-Minerals (MULTIVITAMIN ADULT PO) multivitamin  . VIENVA 0.1-20 MG-MCG tablet Take 1 tablet by mouth daily.  . [DISCONTINUED] escitalopram (LEXAPRO) 10 MG tablet Take 1 tablet (10 mg total) by mouth daily.    Allergies: Patient has No Known Allergies. Family history:  Patient family history includes Alcohol abuse in her father; Anxiety disorder in her sister; Arthritis in her father; Brain cancer in her paternal grandmother; Depression in her sister; Healthy in her son and son; Heart attack in her paternal grandfather; Heart disease in her paternal aunt; Hypertension in  her father; Liver cancer in her maternal grandmother. Social History   Socioeconomic History  . Marital status: Married    Spouse name: Not on file  . Number of children: 2  . Years of education: Not on file  . Highest education level: Not on file  Occupational History  . Occupation: DENTAL ASSISTANCE    Employer: [employer]  Tobacco Use  . Smoking status: Never Smoker  . Smokeless tobacco: Never Used  Vaping Use  . Vaping Use: Never used  Substance and Sexual Activity  . Alcohol use: Yes  . Drug use: No  . Sexual activity: Yes    Birth control/protection: Surgical  Other Topics Concern  . Not on file  Social History Narrative  . Not on  file   Social Determinants of Health   Financial Resource Strain:   . Difficulty of Paying Living Expenses: Not on file  Food Insecurity:   . Worried About Charity fundraiser in the Last Year: Not on file  . Ran Out of Food in the Last Year: Not on file  Transportation Needs:   . Lack of Transportation (Medical): Not on file  . Lack of Transportation (Non-Medical): Not on file  Physical Activity:   . Days of Exercise per Week: Not on file  . Minutes of Exercise per Session: Not on file  Stress:   . Feeling of Stress : Not on file  Social Connections:   . Frequency of Communication with Friends and Family: Not on file  . Frequency of Social Gatherings with Friends and Family: Not on file  . Attends Religious Services: Not on file  . Active Member of Clubs or Organizations: Not on file  . Attends Archivist Meetings: Not on file  . Marital Status: Not on file     Review of Systems: Constitutional: Negative for fever malaise or anorexia Cardiovascular: negative for chest pain Respiratory: negative for SOB or persistent cough Gastrointestinal: negative for abdominal pain  Objective  Vitals: BP 130/80   Pulse 92   Temp 98.7 F (37.1 C) (Temporal)   Ht 5\' 3"  (1.6 m)   Wt 177 lb 9.6 oz (80.6 kg)   SpO2 97%   BMI 31.46 kg/m  General: no acute distress, well appearing, no apparent distress, well groomed Psych:  Alert and oriented x 3,normal mood, behavior, speech, dress, and thought processes.      Commons side effects, risks, benefits, and alternatives for medications and treatment plan prescribed today were discussed, and the patient expressed understanding of the given instructions. Patient is instructed to call or message via MyChart if he/she has any questions or concerns regarding our treatment plan. No barriers to understanding were identified. We discussed Red Flag symptoms and signs in detail. Patient expressed understanding regarding what to do in case of  urgent or emergency type symptoms.   Medication list was reconciled, printed and provided to the patient in AVS. Patient instructions and summary information was reviewed with the patient as documented in the AVS. This note was prepared with assistance of Dragon voice recognition software. Occasional wrong-word or sound-a-like substitutions may have occurred due to the inherent limitations of voice recognition software

## 2020-09-11 IMAGING — US US BREAST*L* LIMITED INC AXILLA
1 series · 2 of 2 positions shown · non-contrast
Comparison: Baseline mammogram 04/12/2020.

CLINICAL DATA: Recall from baseline screening mammography with
tomosynthesis, possible mass in the OUTER LEFT breast at ANTERIOR to
MIDDLE depth.

EXAM:
DIGITAL DIAGNOSTIC LEFT MAMMOGRAM WITH TOMO
ULTRASOUND LEFT BREAST

[Series 1: us breast*left* limited inc axilla · 0.07mm/px · 2 of 2 slices shown]
[im 1/2]
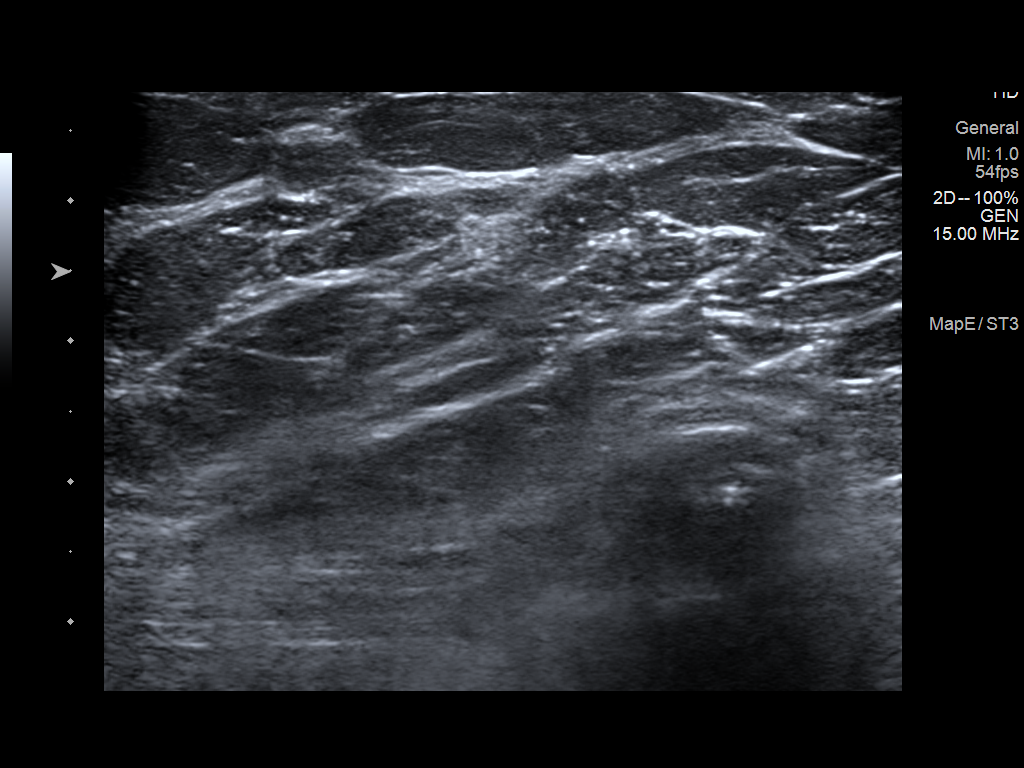
[im 2/2]
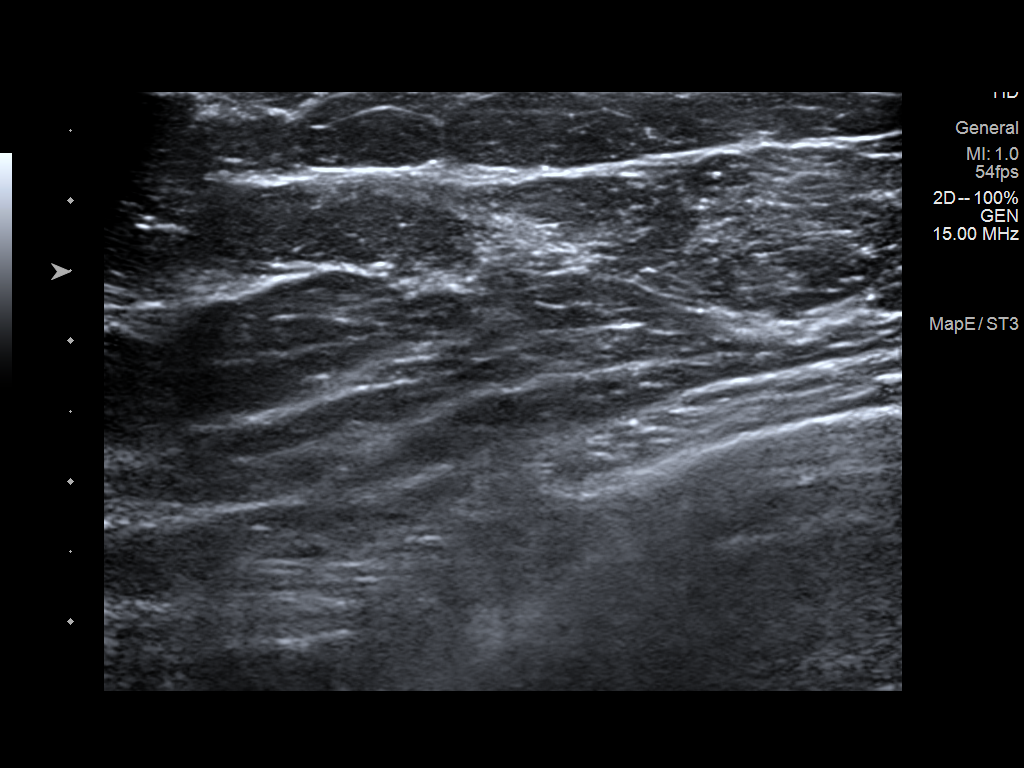

[2 of 2 positions shown; findings below may reference images not displayed]

No prior ultrasound.

ACR Breast Density Category b: There are scattered areas of
fibroglandular density.
FINDINGS: Tomosynthesis and synthesized spot-compression CC and MLO views of
the area of concern in the LEFT breast were obtained.

The mass questioned on screening mammography in the OUTER breast
partially disperses with compression. There is no convincing
underlying mass and there is no associated architectural distortion
or suspicious calcifications.

Targeted LEFT breast ultrasound is performed, showing a focus of
fibroglandular tissue with a rounded configuration at the 2:30
o'clock position approximately 3 cm from the nipple at MIDDLE depth
with a morphology similar to the screening mammographic finding. No
cyst, solid mass or abnormal acoustic shadowing is identified in the
OUTER breast.
IMPRESSION: No mammographic or sonographic evidence of malignancy involving the
LEFT breast.

RECOMMENDATION:
Screening mammogram in one year.(Code:04-G-3XM)

I have discussed the findings and recommendations with the patient.
If applicable, a reminder letter will be sent to the patient
regarding the next appointment.

BI-RADS CATEGORY  1: Negative.

## 2020-09-11 IMAGING — MG MM DIGITAL DIAGNOSTIC UNILAT*L* W/ TOMO W/ CAD
4 series · 4 of 12 positions shown · non-contrast
Comparison: Baseline mammogram 04/12/2020.

CLINICAL DATA: Recall from baseline screening mammography with
tomosynthesis, possible mass in the OUTER LEFT breast at ANTERIOR to
MIDDLE depth.

EXAM:
DIGITAL DIAGNOSTIC LEFT MAMMOGRAM WITH TOMO
ULTRASOUND LEFT BREAST

[L MLO synth-2D]
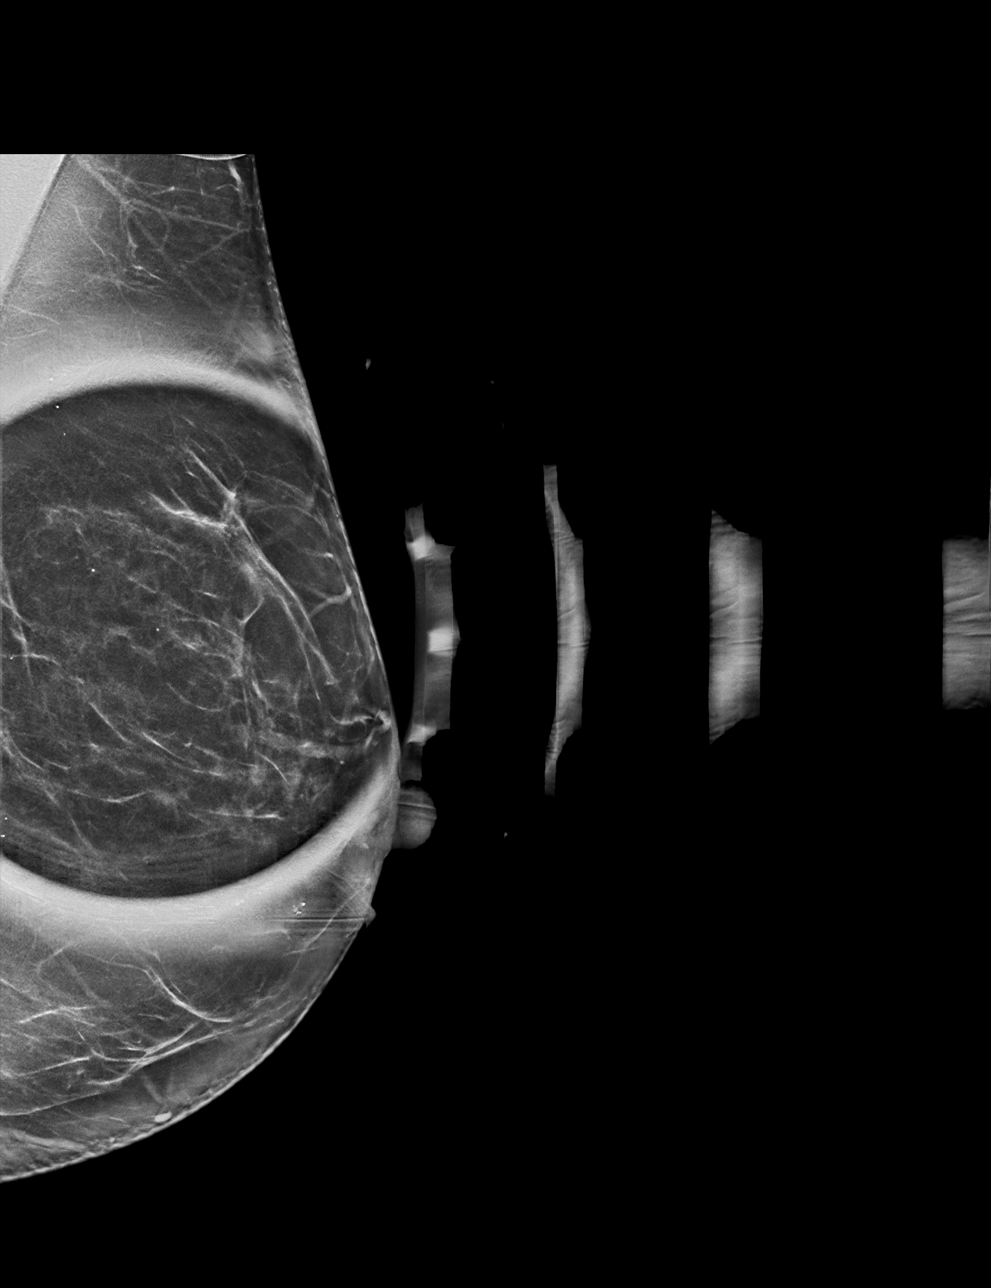

[L CC synth-2D]
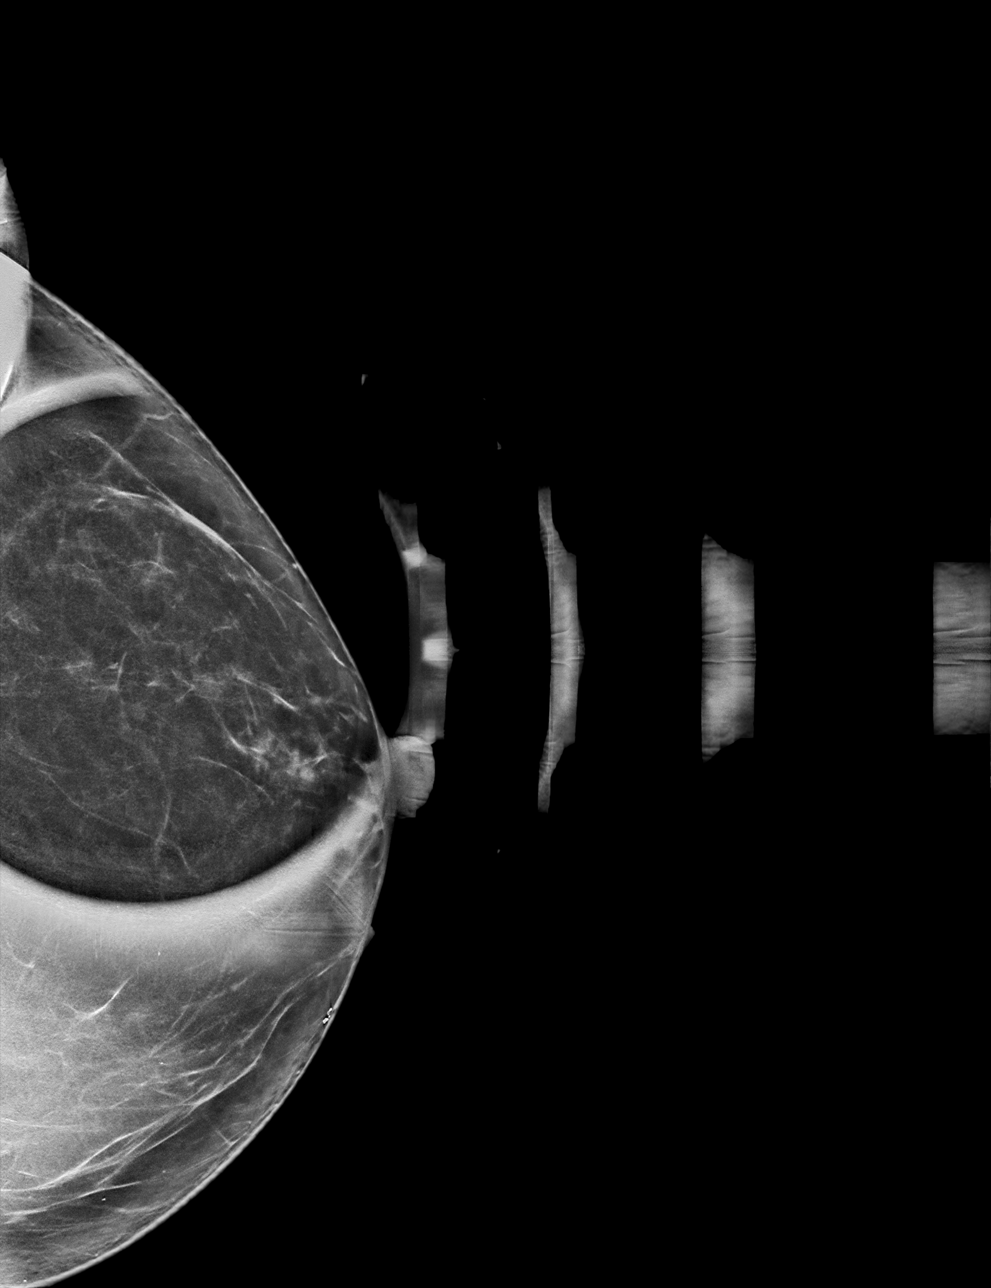

[L MLO tomo · tomo slice 35/68.0]
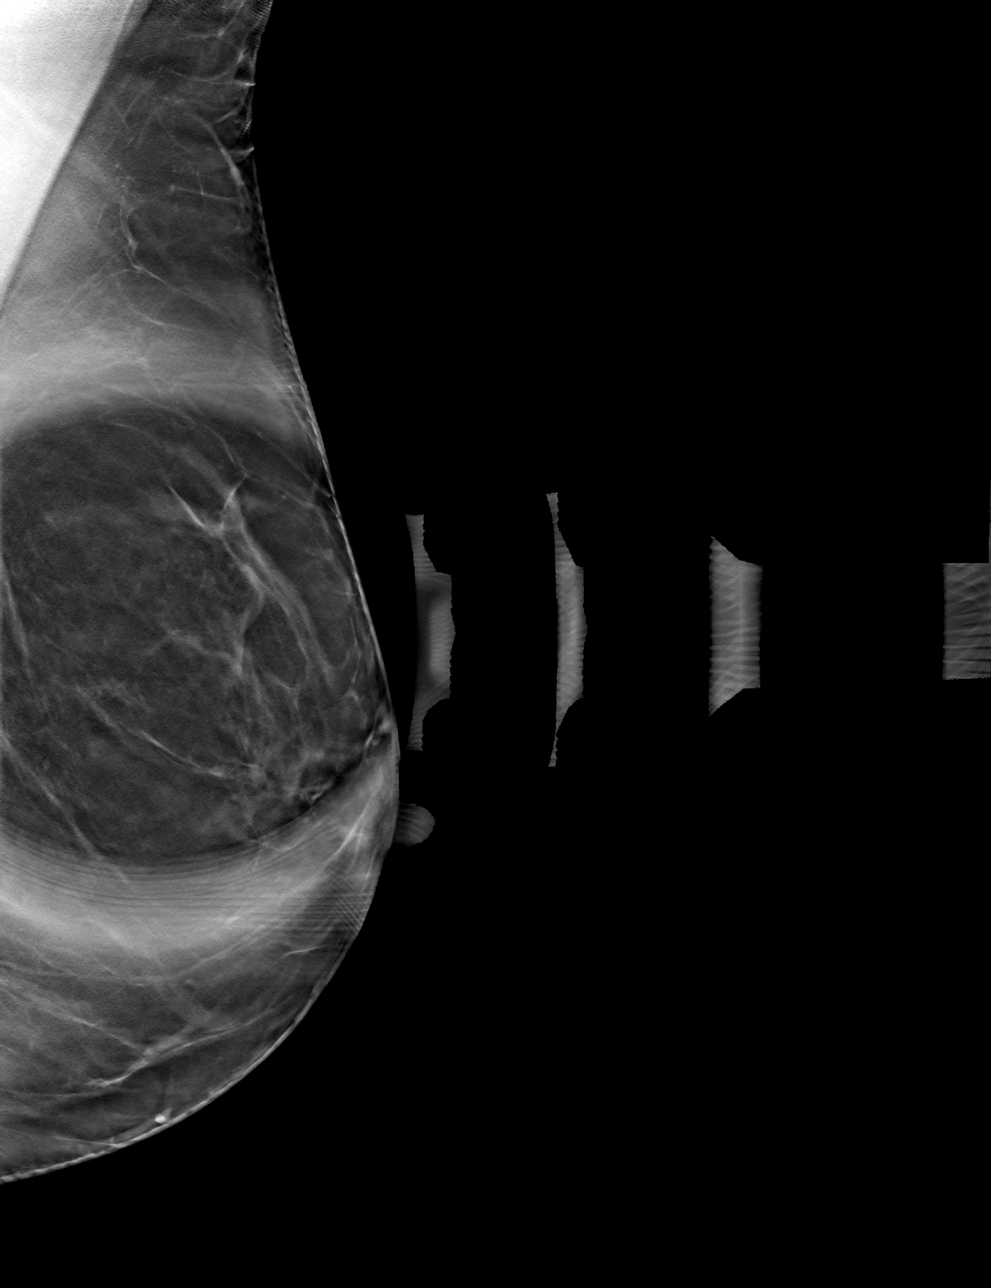

[L CC tomo · tomo slice 32/63.0]
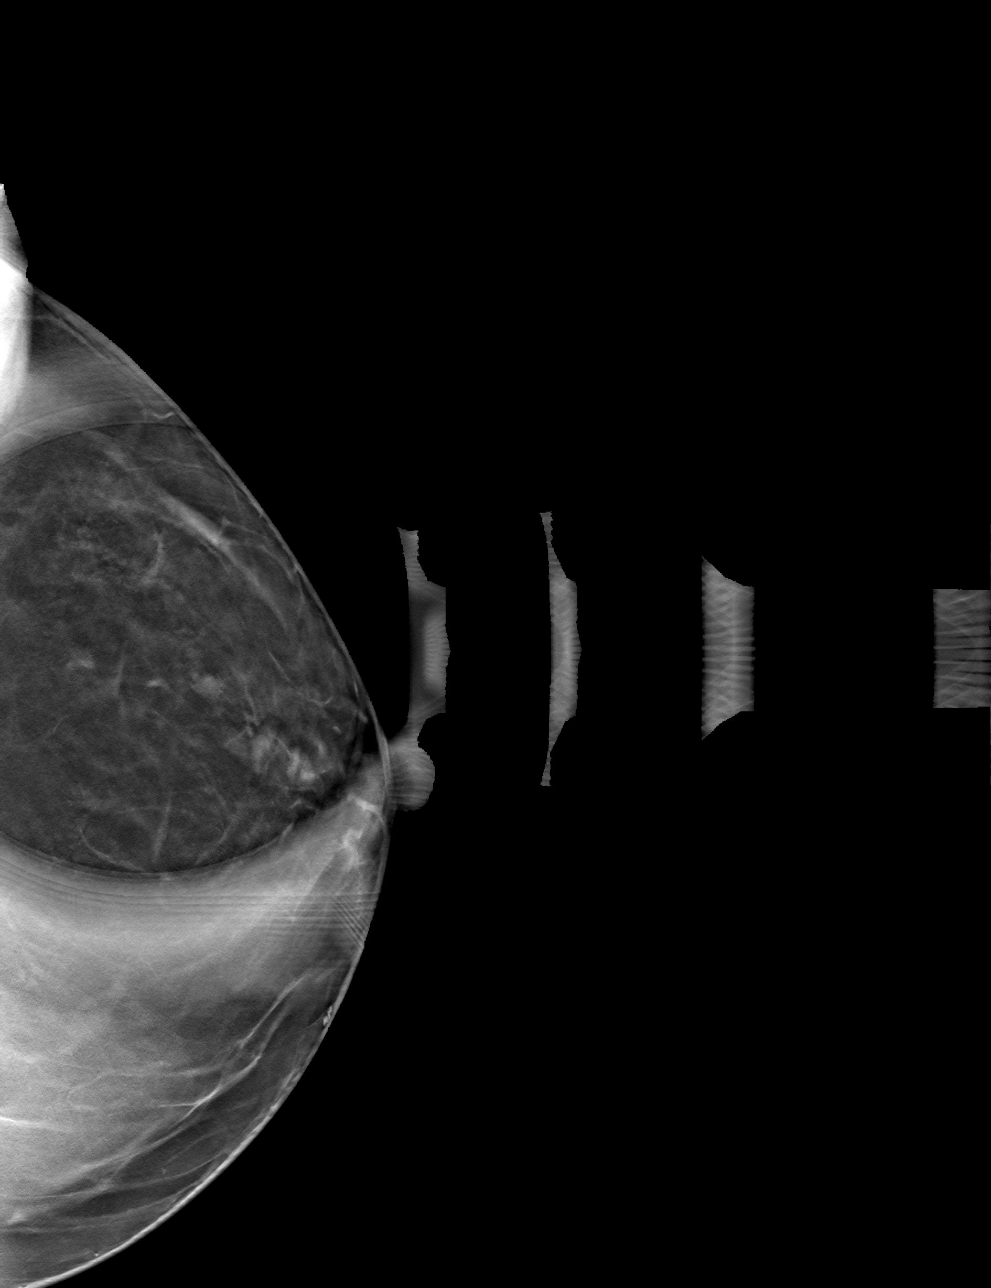

[4 of 12 positions shown; findings below may reference images not displayed]

No prior ultrasound.

ACR Breast Density Category b: There are scattered areas of
fibroglandular density.
FINDINGS: Tomosynthesis and synthesized spot-compression CC and MLO views of
the area of concern in the LEFT breast were obtained.

The mass questioned on screening mammography in the OUTER breast
partially disperses with compression. There is no convincing
underlying mass and there is no associated architectural distortion
or suspicious calcifications.

Targeted LEFT breast ultrasound is performed, showing a focus of
fibroglandular tissue with a rounded configuration at the 2:30
o'clock position approximately 3 cm from the nipple at MIDDLE depth
with a morphology similar to the screening mammographic finding. No
cyst, solid mass or abnormal acoustic shadowing is identified in the
OUTER breast.
IMPRESSION: No mammographic or sonographic evidence of malignancy involving the
LEFT breast.

RECOMMENDATION:
Screening mammogram in one year.(Code:[id number])

I have discussed the findings and recommendations with the patient.
If applicable, a reminder letter will be sent to the patient
regarding the next appointment.

BI-RADS CATEGORY  1: Negative.

## 2020-12-27 ENCOUNTER — Ambulatory Visit: Payer: 59 | Admitting: Family Medicine

## 2021-01-08 ENCOUNTER — Encounter: Payer: Self-pay | Admitting: Family Medicine

## 2021-01-08 ENCOUNTER — Other Ambulatory Visit: Payer: Self-pay

## 2021-01-08 ENCOUNTER — Ambulatory Visit: Payer: 59 | Admitting: Family Medicine

## 2021-01-08 VITALS — BP 138/78 | HR 84 | Temp 98.4°F | Wt 192.2 lb

## 2021-01-08 DIAGNOSIS — Z3041 Encounter for surveillance of contraceptive pills: Secondary | ICD-10-CM | POA: Diagnosis not present

## 2021-01-08 DIAGNOSIS — R7989 Other specified abnormal findings of blood chemistry: Secondary | ICD-10-CM

## 2021-01-08 DIAGNOSIS — E669 Obesity, unspecified: Secondary | ICD-10-CM | POA: Insufficient documentation

## 2021-01-08 DIAGNOSIS — F430 Acute stress reaction: Secondary | ICD-10-CM

## 2021-01-08 DIAGNOSIS — I10 Essential (primary) hypertension: Secondary | ICD-10-CM

## 2021-01-08 NOTE — Progress Notes (Signed)
Subjective  CC:  Chief Complaint  Patient presents with  . Stress    Added on Lexapro10 mg, has noticed improvement     HPI: Dawn Roth is a 40 y.o. female who presents to the office today to address the problems listed above in the chief complaint.  Hypertension f/u: Control is good . Pt reports she is doing well. taking medications as instructed, no medication side effects noted, no TIAs, no chest pain on exertion, no dyspnea on exertion, no swelling of ankles.  Home readings average 120s over 70s consistently.  Denies adverse effects from his BP medications. Compliance with medication is good.  Labetalol 100 mg twice daily  Stress reaction/adjustment disorder: Well-controlled on Lexapro 10 mg daily.  Started in September of last year.  Feels back to herself.  Managing stressors of work and life well.  No adverse effects.  Had mildly elevated ALT on labs back in September.  No jaundice, right upper quadrant pain or history of elevated LFTs.  Rare alcohol or Tylenol use.  oral contraceptives: Regular cycles.  Assessment  1. Essential hypertension   2. Stress reaction   3. Elevated LFTs   4. Oral contraceptive use   5. Obesity (BMI 30-39.9)      Plan    Hypertension f/u: BP control is well controlled.  Continue labetalol 100 twice daily.  Elevated LFT f/u: Very mild elevation.  Will recheck in September.  Avoid hepatotoxins.  Work on weight loss as this could be fatty liver.  Will contraceptive use: Stable.  Continue to control blood pressure well.  Adjustment disorder with stress reaction: Well-controlled on Lexapro 10 daily.  Continue new September.  That can assess whether further use is warranted.   Education regarding management of these chronic disease states was given. Management strategies discussed on successive visits include dietary and exercise recommendations, goals of achieving and maintaining IBW, and lifestyle modifications aiming for adequate sleep and  minimizing stressors.   Follow up: September for complete physical  No orders of the defined types were placed in this encounter.  No orders of the defined types were placed in this encounter.     BP Readings from Last 3 Encounters:  01/08/21 138/78  07/26/20 130/80  06/07/20 118/84   Wt Readings from Last 3 Encounters:  01/08/21 192 lb 3.2 oz (87.2 kg)  07/26/20 177 lb 9.6 oz (80.6 kg)  06/07/20 180 lb 12.8 oz (82 kg)    Lab Results  Component Value Date   CHOL 222 (H) 06/07/2020   CHOL 177 06/02/2019   CHOL 190 02/04/2018   Lab Results  Component Value Date   HDL 71 06/07/2020   HDL 54.70 06/02/2019   HDL 61.00 02/04/2018   Lab Results  Component Value Date   LDLCALC 130 (H) 06/07/2020   LDLCALC 105 (H) 06/02/2019   LDLCALC 117 (H) 02/04/2018   Lab Results  Component Value Date   TRIG 107 06/07/2020   TRIG 85.0 06/02/2019   TRIG 62.0 02/04/2018   Lab Results  Component Value Date   CHOLHDL 3.1 06/07/2020   CHOLHDL 3 06/02/2019   CHOLHDL 3 02/04/2018   No results found for: LDLDIRECT Lab Results  Component Value Date   CREATININE 0.76 06/07/2020   BUN 11 06/07/2020   NA 140 06/07/2020   K 4.3 06/07/2020   CL 104 06/07/2020   CO2 26 06/07/2020   Lab Results  Component Value Date   ALT 36 (H) 06/07/2020   AST 20  06/07/2020   ALKPHOS 72 06/02/2019   BILITOT 0.5 06/07/2020     The 10-year ASCVD risk score Mikey Bussing DC Jr., et al., 2013) is: 0.7%   Values used to calculate the score:     Age: 41 years     Sex: Female     Is Non-Hispanic African American: No     Diabetic: No     Tobacco smoker: No     Systolic Blood Pressure: 007 mmHg     Is BP treated: Yes     HDL Cholesterol: 71 mg/dL     Total Cholesterol: 222 mg/dL  I reviewed the patients updated PMH, FH, and SocHx.    Patient Active Problem List   Diagnosis Date Noted  . Obesity (BMI 30-39.9) 01/08/2021  . Stress reaction 07/26/2020  . 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 Oral  contraceptive use 11/15/2015  . Essential hypertension 06/09/2013  . Mitral valve regurgitation 04/28/2013    Allergies: Patient has no known allergies.  Social History: Patient  reports that she has never smoked. She has never used smokeless tobacco. She reports current alcohol use. She reports that she does not use drugs.  Current Meds  Medication Sig  . escitalopram (LEXAPRO) 10 MG tablet Take 1 tablet (10 mg total) by mouth daily.  Marland Kitchen labetalol (NORMODYNE) 100 MG tablet Take 1 tablet (100 mg total) by mouth 2 (two) times daily.  . Multiple Vitamins-Minerals (MULTIVITAMIN ADULT PO) multivitamin  . VIENVA 0.1-20 MG-MCG tablet Take 1 tablet by mouth daily.    Review of Systems: Cardiovascular: negative for chest pain, palpitations, leg swelling, orthopnea Respiratory: negative for SOB, wheezing or persistent cough Gastrointestinal: negative for abdominal pain Genitourinary: negative for dysuria or gross hematuria  Objective  Vitals: BP 138/78   Pulse 84   Temp 98.4 F (36.9 C) (Temporal)   Wt 192 lb 3.2 oz (87.2 kg)   SpO2 98%   BMI 34.05 kg/m  General: no acute distress  Psych:  Alert and oriented, normal mood and affect HEENT:  Normocephalic, atraumatic, supple neck  Cardiovascular:  RRR without murmur. no edema Respiratory:  Good breath sounds bilaterally, CTAB with normal respiratory effort Neurologic:   Mental status is normal  Commons side effects, risks, benefits, and alternatives for medications and treatment plan prescribed today were discussed, and the patient expressed understanding of the given instructions. Patient is instructed to call or message via MyChart if he/she has any questions or concerns regarding our treatment plan. No barriers to understanding were identified. We discussed Red Flag symptoms and signs in detail. Patient expressed understanding regarding what to do in case of urgent or emergency type symptoms.   Medication list was reconciled, printed  and provided to the patient in AVS. Patient instructions and summary information was reviewed with the patient as documented in the AVS. This note was prepared with assistance of Dragon voice recognition software. Occasional wrong-word or sound-a-like substitutions may have occurred due to the inherent limitations of voice recognition software  This visit occurred during the SARS-CoV-2 public health emergency.  Safety protocols were in place, including screening questions prior to the visit, additional usage of staff PPE, and extensive cleaning of exam room while observing appropriate contact time as indicated for disinfecting solutions.

## 2021-01-08 NOTE — Patient Instructions (Signed)
Please return in September 2022 for your annual complete physical; please come fasting.   If you have any questions or concerns, please don't hesitate to send me a message via MyChart or call the office at [phone]. Thank you for visiting with [place] today! It's our pleasure caring for you.

## 2021-06-20 ENCOUNTER — Encounter: Payer: 59 | Admitting: Family Medicine

## 2021-06-23 ENCOUNTER — Encounter: Payer: Self-pay | Admitting: Family Medicine

## 2021-06-23 ENCOUNTER — Ambulatory Visit (INDEPENDENT_AMBULATORY_CARE_PROVIDER_SITE_OTHER): Payer: 59 | Admitting: Family Medicine

## 2021-06-23 ENCOUNTER — Other Ambulatory Visit: Payer: Self-pay

## 2021-06-23 VITALS — BP 130/90 | HR 72 | Temp 99.0°F | Ht 63.0 in | Wt 206.8 lb

## 2021-06-23 DIAGNOSIS — F430 Acute stress reaction: Secondary | ICD-10-CM | POA: Diagnosis not present

## 2021-06-23 DIAGNOSIS — E669 Obesity, unspecified: Secondary | ICD-10-CM

## 2021-06-23 DIAGNOSIS — I10 Essential (primary) hypertension: Secondary | ICD-10-CM

## 2021-06-23 DIAGNOSIS — Z23 Encounter for immunization: Secondary | ICD-10-CM

## 2021-06-23 DIAGNOSIS — R7989 Other specified abnormal findings of blood chemistry: Secondary | ICD-10-CM

## 2021-06-23 DIAGNOSIS — Z0000 Encounter for general adult medical examination without abnormal findings: Secondary | ICD-10-CM

## 2021-06-23 DIAGNOSIS — Z3041 Encounter for surveillance of contraceptive pills: Secondary | ICD-10-CM | POA: Diagnosis not present

## 2021-06-23 LAB — COMPREHENSIVE METABOLIC PANEL
ALT: 23 U/L (ref 0–35)
AST: 19 U/L (ref 0–37)
Albumin: 4.3 g/dL (ref 3.5–5.2)
Alkaline Phosphatase: 91 U/L (ref 39–117)
BUN: 10 mg/dL (ref 6–23)
CO2: 26 mEq/L (ref 19–32)
Calcium: 9.5 mg/dL (ref 8.4–10.5)
Chloride: 102 mEq/L (ref 96–112)
Creatinine, Ser: 0.77 mg/dL (ref 0.40–1.20)
GFR: 95.94 mL/min (ref >60.00)
Glucose, Bld: 81 mg/dL (ref 70–99)
Potassium: 4.2 mEq/L (ref 3.5–5.1)
Sodium: 137 mEq/L (ref 135–145)
Total Bilirubin: 0.5 mg/dL (ref 0.2–1.2)
Total Protein: 7.5 g/dL (ref 6.0–8.3)

## 2021-06-23 LAB — CBC WITH DIFFERENTIAL/PLATELET
Basophils Absolute: 0.1 10*3/uL (ref 0.0–0.1)
Basophils Relative: 1 % (ref 0.0–3.0)
Eosinophils Absolute: 0.2 10*3/uL (ref 0.0–0.7)
Eosinophils Relative: 3.2 % (ref 0.0–5.0)
HCT: 37.4 % (ref 36.0–46.0)
Hemoglobin: 12.4 g/dL (ref 12.0–15.0)
Lymphocytes Relative: 28.1 % (ref 12.0–46.0)
Lymphs Abs: 1.7 10*3/uL (ref 0.7–4.0)
MCHC: 33.1 g/dL (ref 30.0–36.0)
MCV: 89.8 fl (ref 78.0–100.0)
Monocytes Absolute: 0.3 10*3/uL (ref 0.1–1.0)
Monocytes Relative: 5.2 % (ref 3.0–12.0)
Neutro Abs: 3.7 10*3/uL (ref 1.4–7.7)
Neutrophils Relative %: 62.5 % (ref 43.0–77.0)
Platelets: 278 10*3/uL (ref 150.0–400.0)
RBC: 4.16 Mil/uL (ref 3.87–5.11)
RDW: 13.1 % (ref 11.5–15.5)
WBC: 6 10*3/uL (ref 4.0–10.5)

## 2021-06-23 LAB — LIPID PANEL
Cholesterol: 218 mg/dL — ABNORMAL HIGH (ref 0–200)
HDL: 75.8 mg/dL (ref >39.00)
LDL Cholesterol: 125 mg/dL — ABNORMAL HIGH (ref 0–99)
NonHDL: 142.62
Total CHOL/HDL Ratio: 3
Triglycerides: 88 mg/dL (ref 0.0–149.0)
VLDL: 17.6 mg/dL (ref 0.0–40.0)

## 2021-06-23 LAB — TSH: TSH: 0.85 u[IU]/mL (ref 0.35–5.50)

## 2021-06-23 MED ORDER — ESCITALOPRAM OXALATE 10 MG PO TABS: 10.0000 mg | ORAL_TABLET | Freq: Every day | ORAL | 3 refills | Status: DC

## 2021-06-23 MED ORDER — LABETALOL HCL 100 MG PO TABS: 100.0000 mg | ORAL_TABLET | Freq: Two times a day (BID) | ORAL | 3 refills | Status: DC

## 2021-06-23 NOTE — Progress Notes (Signed)
Subjective  Chief Complaint  Patient presents with   Annual Exam    Fasting   Hypertension    HPI: Dawn Roth is a 40 y.o. female who presents to [facility] today for a Female Wellness Visit.  She also has the concerns and/or needs as listed above in the chief complaint. These will be addressed in addition to the Health Maintenance Visit.   Wellness Visit: annual visit with health maintenance review and exam without Pap  HM: screens are current.  Weight continues to trend upwards. No changes in eating habits. Little exercise. On lexapro for 2 years now.  Chronic disease management visit and/or acute problem visit: Mood: has been well controlled on lexapro. We were going to consider weaning but given recent loss of her father and stressful last month due to Covid in her family, will continue. Discussed possible weight gain related to meds as well. She is overall stable.  HTN: stressed today: father passed suddenly last week, MI. Had coffee this am. Bp had been well controlled. On labetalol 100 bid. Feeling well. Taking medications w/o adverse effects. No symptoms of CHF, angina; no palpitations, sob, cp or lower extremity edema. Compliant with meds.  Ocps w regular menses Elevated LFTs: for recheck today. Asymptomatic  Assessment  1. Annual physical exam   2. Essential hypertension   3. Stress reaction   4. Oral contraceptive use   5. Elevated LFTs   6. Obesity (BMI 30-39.9)      Plan  Female Wellness Visit: Age appropriate Health Maintenance and Prevention measures were discussed with patient. Included topics are cancer screening recommendations, ways to keep healthy (see AVS) including dietary and exercise recommendations, regular eye and dental care, use of seat belts, and avoidance of moderate alcohol use and tobacco use.  BMI: discussed patient's BMI and encouraged positive lifestyle modifications to help get to or maintain a target BMI. HM  needs and immunizations were addressed and ordered. See below for orders. See HM and immunization section for updates. Flu shot today Routine labs and screening tests ordered including cmp, cbc and lipids where appropriate. Discussed recommendations regarding Vit D and calcium supplementation (see AVS)  Chronic disease f/u and/or acute problem visit: (deemed necessary to be done in addition to the wellness visit): HTN:  elevated today: ? Stress related or could be uncontrolled with weight gain. Pt to start monitoring at home. Will add hctz if remains elevated. Check renal function and electrolytes. Obesity: discussed dietary changes and exercise.  OCP Recheck liver tests. Ultrasound if remains elevated. Suspect fatty liver.  Gad/grief: continue lexapro 10. Doing well.   Follow up: 6 mo htn and mood   Orders Placed This Encounter  Procedures   CBC with Differential/Platelet   Comprehensive metabolic panel   Lipid panel   TSH   Hepatitis C antibody   Hepatitis B surface antigen   Hepatitis B core antibody, total   Meds ordered this encounter  Medications   escitalopram (LEXAPRO) 10 MG tablet    Sig: Take 1 tablet (10 mg total) by mouth daily.    Dispense:  90 tablet    Refill:  3   labetalol (NORMODYNE) 100 MG tablet    Sig: Take 1 tablet (100 mg total) by mouth 2 (two) times daily.    Dispense:  180 tablet    Refill:  3    This prescription was filled on 07/22/2020. Any refills authorized will be placed on file.  Body mass index is 36.63 kg/m. Wt Readings from Last 3 Encounters:  06/23/21 206 lb 12.8 oz (93.8 kg)  01/08/21 192 lb 3.2 oz (87.2 kg)  07/26/20 177 lb 9.6 oz (80.6 kg)    Patient Active Problem List   Diagnosis Date Noted   Obesity (BMI 30-39.9) 01/08/2021   Stress reaction 07/26/2020    Work related stress: started lexapro 05/2020    Dermatofibroma 05/13/2016   Oral contraceptive use 11/15/2015   Essential hypertension 06/09/2013   Mitral valve  regurgitation 04/28/2013    ECHO - mild - moderate, Dr. Mauricio Po ECHO 07/2018, mild and stable.     Health Maintenance  Topic Date Due   Hepatitis C Screening  Never done   COVID-19 Vaccine (3 - Booster) 06/21/2020   INFLUENZA VACCINE  04/28/2021   PAP SMEAR-Modifier  04/04/2024   TETANUS/TDAP  01/27/2027   HIV Screening  Completed   HPV VACCINES  Aged Out   Immunization History  Administered Date(s) Administered   Hepatitis B, ped/adol 01/02/2014   Influenza, Quadrivalent, Recombinant, Inj, Pf 09/08/2013   Influenza, Seasonal, Injecte, Preservative Fre 06/29/2015   Influenza,inj,Quad PF,6+ Mos 06/29/2018, 06/02/2019, 06/07/2020   Moderna Sars-Covid-2 Vaccination 12/30/2019, 01/20/2020   Tdap 03/28/2006, 06/29/2015   We updated and reviewed the patient's past history in detail and it is documented below. Allergies: Patient  reports current alcohol use. Past Medical History Patient  has a past medical history of Heart murmur and Hypertension. Past Surgical History Patient  has a past surgical history that includes Cholecystectomy (09/2011); Cesarean section (N/A, 07/17/2015); and Cesarean section with bilateral tubal ligation (Bilateral, 11/17/2017). Social History   Socioeconomic History   Marital status: Married    Spouse name: Not on file   Number of children: 2   Years of education: Not on file   Highest education level: Not on file  Occupational History   Occupation: DENTAL ASSISTANCE    Employer: [employer]  Tobacco Use   Smoking status: Never   Smokeless tobacco: Never  Vaping Use   Vaping Use: Never used  Substance and Sexual Activity   Alcohol use: Yes   Drug use: No   Sexual activity: Yes    Birth control/protection: Surgical  Other Topics Concern   Not on file  Social History Narrative   Not on file   Social Determinants of Health   Financial Resource Strain: Not on file  Food Insecurity: Not on file  Transportation Needs: Not on file   Physical Activity: Not on file  Stress: Not on file  Social Connections: Not on file   Family History  Problem Relation Age of Onset   Hypertension Father    Alcohol abuse Father    Arthritis Father    Depression Sister    Anxiety disorder Sister    Heart disease Paternal Aunt    Liver cancer Maternal Grandmother    Brain cancer Paternal Grandmother    Heart attack Paternal Grandfather    Healthy Son    Healthy Son     Review of Systems: Constitutional: negative for fever or malaise Ophthalmic: negative for photophobia, double vision or loss of vision Cardiovascular: negative for chest pain, dyspnea on exertion, or new LE swelling Respiratory: negative for SOB or persistent cough Gastrointestinal: negative for abdominal pain, change in bowel habits or melena Genitourinary: negative for dysuria or gross hematuria, no abnormal uterine bleeding or disharge Musculoskeletal: negative for new gait disturbance or muscular weakness Integumentary: negative for new or persistent rashes,  no breast lumps Neurological: negative for TIA or stroke symptoms Psychiatric: negative for SI or delusions Allergic/Immunologic: negative for hives  Patient Care Team    Relationship Specialty Notifications Start End  Leamon Arnt, MD PCP - General Family Medicine  02/04/18     Objective  Vitals: BP 130/90   Pulse 72   Temp 99 F (37.2 C) (Temporal)   Ht 5\' 3"  (1.6 m)   Wt 206 lb 12.8 oz (93.8 kg)   LMP 05/12/2021 (Exact Date)   SpO2 98%   BMI 36.63 kg/m  General:  Well developed, well nourished, no acute distress  Psych:  Alert and orientedx3,normal mood and affect HEENT:  Normocephalic, atraumatic, non-icteric sclera, PERRL, supple neck without adenopathy, mass or thyromegaly Cardiovascular:  Normal S1, S2, RRR without gallop, rub or murmur Respiratory:  Good breath sounds bilaterally, CTAB with normal respiratory effort Gastrointestinal: normal bowel sounds, soft, non-tender, no  noted masses. No HSM MSK: no deformities, contusions. Joints are without erythema or swelling.  Skin:  Warm, no rashes or suspicious lesions noted    Commons side effects, risks, benefits, and alternatives for medications and treatment plan prescribed today were discussed, and the patient expressed understanding of the given instructions. Patient is instructed to call or message via MyChart if he/she has any questions or concerns regarding our treatment plan. No barriers to understanding were identified. We discussed Red Flag symptoms and signs in detail. Patient expressed understanding regarding what to do in case of urgent or emergency type symptoms.  Medication list was reconciled, printed and provided to the patient in AVS. Patient instructions and summary information was reviewed with the patient as documented in the AVS. This note was prepared with assistance of Dragon voice recognition software. Occasional wrong-word or sound-a-like substitutions may have occurred due to the inherent limitations of voice recognition software  This visit occurred during the SARS-CoV-2 public health emergency.  Safety protocols were in place, including screening questions prior to the visit, additional usage of staff PPE, and extensive cleaning of exam room while observing appropriate contact time as indicated for disinfecting solutions.

## 2021-06-23 NOTE — Patient Instructions (Signed)
Please return in 6 months for hypertension and mood follow up.   Keep an eye on your blood pressures and send me readings. I'll adjust your medications if remains elevated. We want it < 135/85 consistently and prefer it < 130/80.  I will release your lab results to you on your MyChart account with further instructions. Please reply with any questions.    I'm so sorry to hear about the loss of your father. May you and your sisters recover well.   If you have any questions or concerns, please don't hesitate to send me a message via MyChart or call the office at [phone]. Thank you for visiting with [place] today! It's our pleasure caring for you.

## 2021-06-24 LAB — HEPATITIS C ANTIBODY
Hepatitis C Ab: NONREACTIVE
SIGNAL TO CUT-OFF: 0.01 (ref <1.00)

## 2021-06-24 LAB — HEPATITIS B SURFACE ANTIGEN: Hepatitis B Surface Ag: NONREACTIVE

## 2021-06-24 LAB — HEPATITIS B CORE ANTIBODY, TOTAL: Hep B Core Total Ab: NONREACTIVE

## 2021-07-11 ENCOUNTER — Encounter: Payer: Self-pay | Admitting: Family Medicine

## 2021-12-26 ENCOUNTER — Encounter: Payer: Self-pay | Admitting: Family Medicine

## 2021-12-26 ENCOUNTER — Ambulatory Visit: Payer: 59 | Admitting: Family Medicine

## 2021-12-26 VITALS — BP 126/81 | HR 91 | Temp 98.3°F | Ht 63.0 in | Wt 205.6 lb

## 2021-12-26 DIAGNOSIS — F4322 Adjustment disorder with anxiety: Secondary | ICD-10-CM

## 2021-12-26 DIAGNOSIS — I10 Essential (primary) hypertension: Secondary | ICD-10-CM | POA: Diagnosis not present

## 2021-12-26 DIAGNOSIS — Z3041 Encounter for surveillance of contraceptive pills: Secondary | ICD-10-CM | POA: Diagnosis not present

## 2021-12-26 DIAGNOSIS — E669 Obesity, unspecified: Secondary | ICD-10-CM

## 2021-12-26 MED ORDER — LABETALOL HCL 200 MG PO TABS: 200.0000 mg | ORAL_TABLET | Freq: Two times a day (BID) | ORAL | 3 refills | Status: DC

## 2021-12-26 NOTE — Progress Notes (Signed)
? ?Subjective  ?CC:  ?Chief Complaint  ?Patient presents with  ? Hypertension  ?  No concerns today. Non fasting.  ? Mood  ?  She states Lexapro is effective.   ? ? ?HPI: Dawn Roth is a 41 y.o. female who presents to the office today to address the problems listed above in the chief complaint. ?Hypertension f/u: Control is fair . Pt reports she is doing well. taking medications as instructed, no medication side effects noted, no TIAs, no chest pain on exertion, no dyspnea on exertion, no swelling of ankles. Outside readings typically ok but diastolic often in 38H. Heart rate at rest 80s-90s. She denies adverse effects from his BP medications. Compliance with medication is good.  ?Anxiety: lexapro for several years now. Stable. Feels ready to trial wean. Home life is busy but good (mother of 2 young boys) and works stressors are now improved.  ? ?Assessment  ?1. Essential hypertension   ?2. Adjustment disorder with anxiety   ?3. Oral contraceptive use   ?4. Obesity (BMI 30-39.9)   ? ?  ?Plan  ? ?Hypertension f/u: BP control is fairly well controlled. Will increase labetalol to 200 bid and monitor. Should tolerate and get to good control. ?anxiety f/u: well controlled. Discussed weaning off lexapro and monitoring. See AVS.  ?Continue OCPs. ?Increase activity. ? ?Education regarding management of these chronic disease states was given. Management strategies discussed on successive visits include dietary and exercise recommendations, goals of achieving and maintaining IBW, and lifestyle modifications aiming for adequate sleep and minimizing stressors.  ? ?Follow up: Return in about 6 months (around 06/27/2022) for complete physical, follow up Hypertension. ? ?No orders of the defined types were placed in this encounter. ? ?Meds ordered this encounter  ?Medications  ? labetalol (NORMODYNE) 200 MG tablet  ?  Sig: Take 1 tablet (200 mg total) by mouth 2 (two) times daily.  ?  Dispense:  180 tablet  ?  Refill:  3  ?   Increasing dose up from 100 bid. thanks  ? ?  ? ?BP Readings from Last 3 Encounters:  ?12/26/21 126/81  ?06/23/21 130/90  ?01/08/21 138/78  ? ?Wt Readings from Last 3 Encounters:  ?12/26/21 205 lb 9.6 oz (93.3 kg)  ?06/23/21 206 lb 12.8 oz (93.8 kg)  ?01/08/21 192 lb 3.2 oz (87.2 kg)  ? ? ?Lab Results  ?Component Value Date  ? CHOL 218 (H) 06/23/2021  ? CHOL 222 (H) 06/07/2020  ? CHOL 177 06/02/2019  ? ?Lab Results  ?Component Value Date  ? HDL 75.80 06/23/2021  ? HDL 71 06/07/2020  ? HDL 54.70 06/02/2019  ? ?Lab Results  ?Component Value Date  ? LDLCALC 125 (H) 06/23/2021  ? LDLCALC 130 (H) 06/07/2020  ? LDLCALC 105 (H) 06/02/2019  ? ?Lab Results  ?Component Value Date  ? TRIG 88.0 06/23/2021  ? TRIG 107 06/07/2020  ? TRIG 85.0 06/02/2019  ? ?Lab Results  ?Component Value Date  ? CHOLHDL 3 06/23/2021  ? CHOLHDL 3.1 06/07/2020  ? CHOLHDL 3 06/02/2019  ? ?No results found for: LDLDIRECT ?Lab Results  ?Component Value Date  ? CREATININE 0.77 06/23/2021  ? BUN 10 06/23/2021  ? NA 137 06/23/2021  ? K 4.2 06/23/2021  ? CL 102 06/23/2021  ? CO2 26 06/23/2021  ? ? ?The 10-year ASCVD risk score (Arnett DK, et al., 2019) is: 0.5% ?  Values used to calculate the score: ?    Age: 51 years ?    Sex:  Female ?    Is Non-Hispanic African American: No ?    Diabetic: No ?    Tobacco smoker: No ?    Systolic Blood Pressure: 778 mmHg ?    Is BP treated: Yes ?    HDL Cholesterol: 75.8 mg/dL ?    Total Cholesterol: 218 mg/dL ? ?I reviewed the patients updated PMH, FH, and SocHx.  ?  ?Patient Active Problem List  ? Diagnosis Date Noted  ? Obesity (BMI 30-39.9) 01/08/2021  ? Adjustment disorder with anxiety 07/26/2020  ? Dermatofibroma 05/13/2016  ? Oral contraceptive use 11/15/2015  ? Essential hypertension 06/09/2013  ? Mitral valve regurgitation 04/28/2013  ? ? ?Allergies: Patient has no known allergies. ? ?Social History: ?Patient  reports that she has never smoked. She has never used smokeless tobacco. She reports current alcohol  use. She reports that she does not use drugs. ? ?Current Meds  ?Medication Sig  ? escitalopram (LEXAPRO) 10 MG tablet Take 1 tablet (10 mg total) by mouth daily.  ? Multiple Vitamins-Minerals (MULTIVITAMIN ADULT PO) multivitamin  ? Norethin-Eth Estrad-Fe Biphas (LO LOESTRIN FE PO) Take by mouth.  ? [DISCONTINUED] labetalol (NORMODYNE) 100 MG tablet Take 1 tablet (100 mg total) by mouth 2 (two) times daily.  ? ? ?Review of Systems: ?Cardiovascular: negative for chest pain, palpitations, leg swelling, orthopnea ?Respiratory: negative for SOB, wheezing or persistent cough ?Gastrointestinal: negative for abdominal pain ?Genitourinary: negative for dysuria or gross hematuria ? ?Objective  ?Vitals: BP 126/81   Pulse 91   Temp 98.3 ?F (36.8 ?C) (Temporal)   Ht '5\' 3"'$  (1.6 m)   Wt 205 lb 9.6 oz (93.3 kg)   SpO2 99%   BMI 36.42 kg/m?  ?General: no acute distress  ?Psych:  Alert and oriented, normal mood and affect ?HEENT:  Normocephalic, atraumatic, supple neck  ?Cardiovascular:  RRR without murmur. no edema ?Respiratory:  Good breath sounds bilaterally, CTAB with normal respiratory effort ?Neurologic:   Mental status is normal ?Commons side effects, risks, benefits, and alternatives for medications and treatment plan prescribed today were discussed, and the patient expressed understanding of the given instructions. Patient is instructed to call or message via MyChart if he/she has any questions or concerns regarding our treatment plan. No barriers to understanding were identified. We discussed Red Flag symptoms and signs in detail. Patient expressed understanding regarding what to do in case of urgent or emergency type symptoms.  ?Medication list was reconciled, printed and provided to the patient in AVS. Patient instructions and summary information was reviewed with the patient as documented in the AVS. ?This note was prepared with assistance of Systems analyst. Occasional wrong-word or sound-a-like  substitutions may have occurred due to the inherent limitations of voice recognition software ? ?This visit occurred during the SARS-CoV-2 public health emergency.  Safety protocols were in place, including screening questions prior to the visit, additional usage of staff PPE, and extensive cleaning of exam room while observing appropriate contact time as indicated for disinfecting solutions.  ?

## 2021-12-26 NOTE — Patient Instructions (Signed)
Please return in September 2023 for your complete physical.  ? ?Wean the lexapro as follows: ?Take 1/2 tab daily for one week, ?Then take 1/2 tab 4x/week, ?Then take 1/2 tab on M and Th, then stop.  ?Monitor your mood, anxiety levels, and irritability over the next 2-3 months to see if you remain stable.  ? ?Monitor your blood pressure outside of the office and send me a few readings in a month or so. Let me know if you have any problems or side effects. I'd like your bp to be 110s/60s-120s/70s consistently.  ? ?BP Readings from Last 3 Encounters:  ?12/26/21 126/81  ?06/23/21 130/90  ?01/08/21 138/78  ? ? ?If you have any questions or concerns, please don't hesitate to send me a message via MyChart or call the office at [phone]. Thank you for visiting with [place] today! It's our pleasure caring for you.  ?

## 2022-01-27 ENCOUNTER — Encounter: Payer: Self-pay | Admitting: Family Medicine

## 2022-06-22 ENCOUNTER — Encounter: Payer: Self-pay | Admitting: *Deleted

## 2022-06-24 ENCOUNTER — Other Ambulatory Visit: Payer: Self-pay | Admitting: Family Medicine

## 2022-09-02 ENCOUNTER — Ambulatory Visit (INDEPENDENT_AMBULATORY_CARE_PROVIDER_SITE_OTHER): Payer: 59 | Admitting: Family Medicine

## 2022-09-02 ENCOUNTER — Encounter: Payer: Self-pay | Admitting: Family Medicine

## 2022-09-02 VITALS — BP 120/80 | HR 72 | Temp 98.6°F | Ht 63.0 in | Wt 200.6 lb

## 2022-09-02 DIAGNOSIS — Z6372 Alcoholism and drug addiction in family: Secondary | ICD-10-CM | POA: Diagnosis not present

## 2022-09-02 DIAGNOSIS — Z0000 Encounter for general adult medical examination without abnormal findings: Secondary | ICD-10-CM

## 2022-09-02 DIAGNOSIS — F4322 Adjustment disorder with anxiety: Secondary | ICD-10-CM

## 2022-09-02 DIAGNOSIS — I10 Essential (primary) hypertension: Secondary | ICD-10-CM

## 2022-09-02 LAB — COMPREHENSIVE METABOLIC PANEL
ALT: 13 U/L (ref 0–35)
AST: 14 U/L (ref 0–37)
Albumin: 4.5 g/dL (ref 3.5–5.2)
Alkaline Phosphatase: 91 U/L (ref 39–117)
BUN: 14 mg/dL (ref 6–23)
CO2: 28 mEq/L (ref 19–32)
Calcium: 9.1 mg/dL (ref 8.4–10.5)
Chloride: 102 mEq/L (ref 96–112)
Creatinine, Ser: 0.83 mg/dL (ref 0.40–1.20)
GFR: 86.94 mL/min (ref >60.00)
Glucose, Bld: 84 mg/dL (ref 70–99)
Potassium: 4 mEq/L (ref 3.5–5.1)
Sodium: 137 mEq/L (ref 135–145)
Total Bilirubin: 0.3 mg/dL (ref 0.2–1.2)
Total Protein: 7.5 g/dL (ref 6.0–8.3)

## 2022-09-02 LAB — TSH: TSH: 1.38 u[IU]/mL (ref 0.35–5.50)

## 2022-09-02 LAB — CBC WITH DIFFERENTIAL/PLATELET
Basophils Absolute: 0.1 10*3/uL (ref 0.0–0.1)
Basophils Relative: 0.9 % (ref 0.0–3.0)
Eosinophils Absolute: 0.2 10*3/uL (ref 0.0–0.7)
Eosinophils Relative: 2.2 % (ref 0.0–5.0)
HCT: 37.7 % (ref 36.0–46.0)
Hemoglobin: 12.5 g/dL (ref 12.0–15.0)
Lymphocytes Relative: 26.6 % (ref 12.0–46.0)
Lymphs Abs: 2.5 10*3/uL (ref 0.7–4.0)
MCHC: 33 g/dL (ref 30.0–36.0)
MCV: 89.8 fl (ref 78.0–100.0)
Monocytes Absolute: 0.5 10*3/uL (ref 0.1–1.0)
Monocytes Relative: 5 % (ref 3.0–12.0)
Neutro Abs: 6 10*3/uL (ref 1.4–7.7)
Neutrophils Relative %: 65.3 % (ref 43.0–77.0)
Platelets: 323 10*3/uL (ref 150.0–400.0)
RBC: 4.2 Mil/uL (ref 3.87–5.11)
RDW: 13.7 % (ref 11.5–15.5)
WBC: 9.3 10*3/uL (ref 4.0–10.5)

## 2022-09-02 LAB — LIPID PANEL
Cholesterol: 237 mg/dL — ABNORMAL HIGH (ref 0–200)
HDL: 63.1 mg/dL (ref >39.00)
LDL Cholesterol: 147 mg/dL — ABNORMAL HIGH (ref 0–99)
NonHDL: 173.47
Total CHOL/HDL Ratio: 4
Triglycerides: 130 mg/dL (ref 0.0–149.0)
VLDL: 26 mg/dL (ref 0.0–40.0)

## 2022-09-02 NOTE — Patient Instructions (Signed)

## 2022-09-02 NOTE — Progress Notes (Signed)
Subjective  Chief Complaint  Patient presents with   Annual Exam    Pt here for Annual Exam and is currently fasting     HPI: Dawn Roth is a 41 y.o. female who presents to [facility] today for a Female Wellness Visit. She also has the concerns and/or needs as listed above in the chief complaint. These will be addressed in addition to the Health Maintenance Visit.   Wellness Visit: annual visit with health maintenance review and exam without Pap  HM: screens all current. Flu shot today Chronic disease f/u and/or acute problem visit: (deemed necessary to be done in addition to the wellness visit): HTN: Feeling well. Taking medications w/o adverse effects. No symptoms of CHF, angina; no palpitations, sob, cp or lower extremity edema. Compliant with meds.  Restarted lexapro over the summer to help managed adjustment anxiety; fortunately, husband is now again in recovery and doing well. She feels well. No adverse effects.    Assessment  1. Annual physical exam   2. Essential hypertension   3. Adjustment disorder with anxiety   4. Spouse of alcoholic husband      Plan  Female Wellness Visit: Age appropriate Health Maintenance and Prevention measures were discussed with patient. Included topics are cancer screening recommendations, ways to keep healthy (see AVS) including dietary and exercise recommendations, regular eye and dental care, use of seat belts, and avoidance of moderate alcohol use and tobacco use.  BMI: discussed patient's BMI and encouraged positive lifestyle modifications to help get to or maintain a target BMI. HM needs and immunizations were addressed and ordered. See below for orders. See HM and immunization section for updates. Routine labs and screening tests ordered including cmp, cbc and lipids where appropriate. Discussed recommendations regarding Vit D and calcium supplementation (see AVS)  Chronic disease management visit and/or  acute problem visit: Conintue labetalol 200 bid for bp; well controlled. Check renal function and electrolytes. Anxiety is well controlled on lexapro. Monitor weight gain and mood Flu shot today  Follow up: 12 mo for cpe and htn  Orders Placed This Encounter  Procedures   CBC with Differential/Platelet   Comprehensive metabolic panel   Lipid panel   TSH   No orders of the defined types were placed in this encounter.     Body mass index is 35.53 kg/m. Wt Readings from Last 3 Encounters:  09/02/22 200 lb 9.6 oz (91 kg)  12/26/21 205 lb 9.6 oz (93.3 kg)  06/23/21 206 lb 12.8 oz (93.8 kg)     Patient Active Problem List   Diagnosis Date Noted   Spouse of alcoholic husband 95/28/4132   Obesity (BMI 30-39.9) 01/08/2021   Adjustment disorder with anxiety 07/26/2020    Work related stress: started lexapro 05/2020, weaning trial 11/2021    Dermatofibroma 05/13/2016   Oral contraceptive use 11/15/2015   Essential hypertension 06/09/2013   Mitral valve regurgitation 04/28/2013    ECHO - mild - moderate, Dr. Mauricio Po ECHO 07/2018, mild and stable.     Health Maintenance  Topic Date Due   INFLUENZA VACCINE  04/28/2022   COVID-19 Vaccine (3 - 2023-24 season) 09/18/2022 (Originally 05/29/2022)   PAP SMEAR-Modifier  04/04/2024   DTaP/Tdap/Td (3 - Td or Tdap) 06/28/2025   Hepatitis C Screening  Completed   HIV Screening  Completed   HPV VACCINES  Aged Out   Immunization History  Administered Date(s) Administered   Hepatitis B, PED/ADOLESCENT 01/02/2014   Influenza, Quadrivalent, Recombinant, Inj,  Pf 09/08/2013   Influenza, Seasonal, Injecte, Preservative Fre 06/29/2015   Influenza,inj,Quad PF,6+ Mos 06/29/2018, 06/02/2019, 06/07/2020, 06/23/2021   Moderna Sars-Covid-2 Vaccination 12/30/2019, 01/20/2020   Tdap 03/28/2006, 06/29/2015   We updated and reviewed the patient's past history in detail and it is documented below. Allergies: Patient has No Known Allergies. Past Medical  History Patient  has a past medical history of Heart murmur and Hypertension. Past Surgical History Patient  has a past surgical history that includes Cholecystectomy (09/2011); Cesarean section (N/A, 07/17/2015); and Cesarean section with bilateral tubal ligation (Bilateral, 11/17/2017). Family History: Patient family history includes Alcohol abuse in her father; Anxiety disorder in her sister; Arthritis in her father; Brain cancer in her paternal grandmother; Depression in her sister; Healthy in her son and son; Heart attack in her paternal grandfather; Heart attack (age of onset: 85) in her father; Heart disease in her paternal aunt; Hypertension in her father; Learning disabilities in her maternal grandfather; Liver cancer in her maternal grandmother. Social History:  Patient  reports that she has never smoked. She has never used smokeless tobacco. She reports current alcohol use. She reports that she does not use drugs.  Review of Systems: Constitutional: negative for fever or malaise Ophthalmic: negative for photophobia, double vision or loss of vision Cardiovascular: negative for chest pain, dyspnea on exertion, or new LE swelling Respiratory: negative for SOB or persistent cough Gastrointestinal: negative for abdominal pain, change in bowel habits or melena Genitourinary: negative for dysuria or gross hematuria, no abnormal uterine bleeding or disharge Musculoskeletal: negative for new gait disturbance or muscular weakness Integumentary: negative for new or persistent rashes, no breast lumps Neurological: negative for TIA or stroke symptoms Psychiatric: negative for SI or delusions Allergic/Immunologic: negative for hives  Patient Care Team    Relationship Specialty Notifications Start End  Leamon Arnt, MD PCP - General Family Medicine  02/04/18     Objective  Vitals: BP 120/80   Pulse 72   Temp 98.6 F (37 C)   Ht '5\' 3"'$  (1.6 m)   Wt 200 lb 9.6 oz (91 kg)   SpO2 97%   BMI  35.53 kg/m  General:  Well developed, well nourished, no acute distress  Psych:  Alert and orientedx3,normal mood and affect HEENT:  Normocephalic, atraumatic, non-icteric sclera,  supple neck without adenopathy, mass or thyromegaly Cardiovascular:  Normal S1, S2, RRR without gallop, rub or murmur Respiratory:  Good breath sounds bilaterally, CTAB with normal respiratory effort Gastrointestinal: normal bowel sounds, soft, non-tender, no noted masses. No HSM MSK: no deformities, contusions. Joints are without erythema or swelling.  Skin:  Warm, no rashes or suspicious lesions noted   Commons side effects, risks, benefits, and alternatives for medications and treatment plan prescribed today were discussed, and the patient expressed understanding of the given instructions. Patient is instructed to call or message via MyChart if he/she has any questions or concerns regarding our treatment plan. No barriers to understanding were identified. We discussed Red Flag symptoms and signs in detail. Patient expressed understanding regarding what to do in case of urgent or emergency type symptoms.  Medication list was reconciled, printed and provided to the patient in AVS. Patient instructions and summary information was reviewed with the patient as documented in the AVS. This note was prepared with assistance of Dragon voice recognition software. Occasional wrong-word or sound-a-like substitutions may have occurred due to the inherent limitations of voice recognition software

## 2022-09-10 ENCOUNTER — Encounter: Payer: Self-pay | Admitting: *Deleted

## 2022-09-29 ENCOUNTER — Encounter: Payer: Self-pay | Admitting: Family Medicine

## 2022-09-30 ENCOUNTER — Ambulatory Visit: Admit: 2022-09-30 | Payer: 59

## 2022-10-23 ENCOUNTER — Ambulatory Visit: Payer: 59 | Admitting: Family Medicine

## 2022-10-23 ENCOUNTER — Encounter: Payer: Self-pay | Admitting: Family Medicine

## 2022-10-23 VITALS — BP 124/69 | HR 89 | Temp 98.3°F | Ht 63.0 in | Wt 201.2 lb

## 2022-10-23 DIAGNOSIS — M25571 Pain in right ankle and joints of right foot: Secondary | ICD-10-CM | POA: Diagnosis not present

## 2022-10-23 DIAGNOSIS — S93401D Sprain of unspecified ligament of right ankle, subsequent encounter: Secondary | ICD-10-CM | POA: Diagnosis not present

## 2022-10-23 LAB — URIC ACID: Uric Acid, Serum: 3.5 mg/dL (ref 2.4–7.0)

## 2022-10-23 LAB — SEDIMENTATION RATE: Sed Rate: 48 mm/hr — ABNORMAL HIGH (ref 0–20)

## 2022-10-23 MED ORDER — PREDNISONE 10 MG PO TABS: ORAL_TABLET | ORAL | 0 refills | Status: DC

## 2022-10-23 NOTE — Patient Instructions (Signed)
Please follow up if symptoms do not improve or as needed.    Purchase a figure of eight ankle support and wear for at least the next 2 weeks during the day.  Take the prednisone as directed. We will call you to start physical therapy.   I will let you know your lab results.

## 2022-10-23 NOTE — Progress Notes (Signed)
Subjective  CC:  Chief Complaint  Patient presents with   Ankle Pain    Pt states right ankle pain, swollen. Pt states started week before christmas and began to hurt out the blue.     HPI: Dawn Roth is a 42 y.o. female who presents to the office today to address the problems listed above in the chief complaint. 42 year old female without any history of arthralgias, joint pains or right ankle injuries presents due to persistent right ankle pain.  Almost a month ago, she awoke in the middle the night with right ankle pain and swelling.  Also with some muscle spasms in that leg.  She does not recall any twisting, injury, falling.  The pain and swelling is on the lateral ankle.  Since, pain persists with walking, hurts almost daily.  Increased swelling by the end of the day.  No redness or warmth noted.  No other joint pain.  She did have an urgent care evaluation, I reviewed the note and x-ray report.  X-ray was negative for fracture, calcaneal DJD changes present.  Treated for ankle sprain with Ace wrap and NSAIDs.  She is taking NSAIDs intermittently with unclear benefit.  Has used Ace wrap intermittently with unclear benefit.  Still tender laterally and swollen.  No other foot pain.  No calf pain.  No knee pain. Family history is negative for rheumatoid arthritis.  No history of gout Assessment  1. Acute right ankle pain   2. Sprain of right ankle, unspecified ligament, subsequent encounter      Plan  Subacute right ankle pain: Exam is most consistent with a ankle sprain however no injury or twisting recalled.  Never really treated properly.  Recommend figure-of-eight splint, trial of prednisone and start physical therapy.  X-rays reassuring.  However differential does include other arthropathies including gout, inflammatory arthritis etc.  Will check baseline screening labs.  She will follow-up if not improving.  Follow up: As needed Visit date not found  Orders Placed This Encounter   Procedures   Sedimentation rate   Uric acid   Rheumatoid Factor   Ambulatory referral to Physical Therapy   Meds ordered this encounter  Medications   predniSONE (DELTASONE) 10 MG tablet    Sig: Take 4 tabs qd x 2 days, 3 qd x 2 days, 2 qd x 2d, 1qd x 3 days    Dispense:  21 tablet    Refill:  0      I reviewed the patients updated PMH, FH, and SocHx.    Patient Active Problem List   Diagnosis Date Noted   Spouse of alcoholic husband 28/78/6767   Obesity (BMI 30-39.9) 01/08/2021   Adjustment disorder with anxiety 07/26/2020   Dermatofibroma 05/13/2016   Oral contraceptive use 11/15/2015   Essential hypertension 06/09/2013   Mitral valve regurgitation 04/28/2013   Current Meds  Medication Sig   escitalopram (LEXAPRO) 10 MG tablet Take 1 tablet (10 mg total) by mouth daily.   labetalol (NORMODYNE) 200 MG tablet Take 1 tablet (200 mg total) by mouth 2 (two) times daily.   Multiple Vitamins-Minerals (MULTIVITAMIN ADULT PO) multivitamin   Norethin-Eth Estrad-Fe Biphas (LO LOESTRIN FE PO) Take by mouth.   predniSONE (DELTASONE) 10 MG tablet Take 4 tabs qd x 2 days, 3 qd x 2 days, 2 qd x 2d, 1qd x 3 days    Allergies: Patient has No Known Allergies. Family History: Patient family history includes Alcohol abuse in her father; Anxiety disorder in her sister;  Arthritis in her father; Brain cancer in her paternal grandmother; Depression in her sister; Healthy in her son and son; Heart attack in her paternal grandfather; Heart attack (age of onset: 17) in her father; Heart disease in her paternal aunt; Hypertension in her father; Learning disabilities in her maternal grandfather; Liver cancer in her maternal grandmother. Social History:  Patient  reports that she has never smoked. She has never used smokeless tobacco. She reports current alcohol use. She reports that she does not use drugs.  Review of Systems: Constitutional: Negative for fever malaise or anorexia Cardiovascular:  negative for chest pain Respiratory: negative for SOB or persistent cough Gastrointestinal: negative for abdominal pain  Objective  Vitals: BP 124/69 (BP Location: Left Arm, Patient Position: Sitting)   Pulse 89   Temp 98.3 F (36.8 C) (Temporal)   Ht '5\' 3"'$  (1.6 m)   Wt 201 lb 3.2 oz (91.3 kg)   SpO2 97%   BMI 35.64 kg/m  General: no acute distress , A&Ox3 Right ankle: edema present over lateral malleolus. Ttp over lateral ligaments and pain with inversion.  No pain with dorsi or plantarflexion.  No ecchymosis.  No bony tenderness.  Midfoot is normal-appearing and nontender.  No erythema or warmth.  Normal gait Commons side effects, risks, benefits, and alternatives for medications and treatment plan prescribed today were discussed, and the patient expressed understanding of the given instructions. Patient is instructed to call or message via MyChart if he/she has any questions or concerns regarding our treatment plan. No barriers to understanding were identified. We discussed Red Flag symptoms and signs in detail. Patient expressed understanding regarding what to do in case of urgent or emergency type symptoms.  Medication list was reconciled, printed and provided to the patient in AVS. Patient instructions and summary information was reviewed with the patient as documented in the AVS. This note was prepared with assistance of Dragon voice recognition software. Occasional wrong-word or sound-a-like substitutions may have occurred due to the inherent limitations of voice recognition software

## 2022-10-24 LAB — RHEUMATOID FACTOR: Rheumatoid fact SerPl-aCnc: <14 IU/mL (ref <14)

## 2022-11-06 ENCOUNTER — Ambulatory Visit (INDEPENDENT_AMBULATORY_CARE_PROVIDER_SITE_OTHER): Payer: 59 | Admitting: Physical Therapy

## 2022-11-06 DIAGNOSIS — R2689 Other abnormalities of gait and mobility: Secondary | ICD-10-CM | POA: Diagnosis not present

## 2022-11-06 DIAGNOSIS — M25571 Pain in right ankle and joints of right foot: Secondary | ICD-10-CM | POA: Diagnosis not present

## 2022-11-06 NOTE — Therapy (Addendum)
OUTPATIENT PHYSICAL THERAPY LOWER EXTREMITY EVALUATION   Patient Name: Dawn Roth MRN: [id number] DOB:1980-11-18, 42 y.o., female Today's Date: 11/06/2022  END OF SESSION:  PT End of Session - 11/08/22 1619     Visit Number 1    Number of Visits 16    Date for PT Re-Evaluation 01/01/23    Authorization Type [employer]    PT Start Time 1235    PT Stop Time 1315    PT Time Calculation (min) 40 min    Activity Tolerance Patient tolerated treatment well    Behavior During Therapy WFL for tasks assessed/performed             Past Medical History:  Diagnosis Date   Heart murmur    MITRAL VALUVE REGURITATION   Hypertension    chronic HTN, on meds   Past Surgical History:  Procedure Laterality Date   CESAREAN SECTION N/A 07/17/2015   Procedure: CESAREAN SECTION;  Surgeon: Candice Camp, MD;  Location: [facility];  Service: Obstetrics;  Laterality: N/A;  Primary edc 07/14/15 nkda   CESAREAN SECTION WITH BILATERAL TUBAL LIGATION Bilateral 11/17/2017   Procedure: CESAREAN SECTION WITH BILATERAL TUBAL LIGATION;  Surgeon: Zelphia Cairo, MD;  Location: [facility];  Service: Obstetrics;  Laterality: Bilateral;  Repeat edc 11/27/17 NKDA Tracey RNFA   CHOLECYSTECTOMY  09/2011   Patient Active Problem List   Diagnosis Date Noted   Spouse of alcoholic husband 40/98/1191   Obesity (BMI 30-39.9) 01/08/2021   Adjustment disorder with anxiety 07/26/2020   Dermatofibroma 05/13/2016   Oral contraceptive use 11/15/2015   Essential hypertension 06/09/2013   Mitral valve regurgitation 04/28/2013    PCP: Asencion Partridge   REFERRING PROVIDER: Asencion Partridge   REFERRING DIAG: R ankle pain   THERAPY DIAG:  Pain in right ankle and joints of right foot  Other abnormalities of gait and mobility  Rationale for Evaluation and Treatment: Rehabilitation  ONSET DATE:   SUBJECTIVE:   SUBJECTIVE STATEMENT: Pt states pain in R ankle that Started before christmas, no incident to report. Has been  very swollen by end of the days, with Minimal improvement for about 1 month.  Did have round of prednisone last week, has been less swollen. She has not had previous pain in ankle. She works as Sales executive.   PERTINENT HISTORY: none  PAIN:  Are you having pain? Yes: NPRS scale: 2-3/10 Pain location: R ankle/lateral foot  Pain description: sore, swollen Aggravating factors: standing, walking activity  Relieving factors: rest, ice.  PRECAUTIONS: None  WEIGHT BEARING RESTRICTIONS: No  FALLS:  Has patient fallen in last 6 months? No  PLOF: Independent  PATIENT GOALS: decreased pain in ankle.   NEXT MD VISIT:   OBJECTIVE:   DIAGNOSTIC FINDINGS:  Impression   1.  No acute fracture or malalignment. 2.  Joint spaces are maintained. 3.  Chronic degenerative calcaneal enthesopathy.  PATIENT SURVEYS:   COGNITION: Overall cognitive status: Within functional limits for tasks assessed     SENSATION: WFL  EDEMA:  No swelling in ankle today   POSTURE:  Standing; R foot: toeing out and R hip in ER;  Gait: L foot slightly pronated, initial contact on inside of heel.  R foot toeing out   PALPATION: Mild tenderness at lateral ankle. No tenderness in peroneal distribution   LOWER EXTREMITY ROM:  Active ROM Right eval Left eval  Hip flexion wfl wfl  Hip extension    Hip abduction wfl wfl  Hip adduction    Hip  internal rotation  hypermobile  Hip external rotation hypermobile   Knee flexion    Knee extension    Ankle dorsiflexion    Ankle plantarflexion    Ankle inversion 34 43  Ankle eversion 15 20   (Blank rows = not tested)  LOWER EXTREMITY MMT:  MMT Right eval Left eval  Hip flexion 4 4  Hip extension    Hip abduction 4 4  Hip adduction    Hip internal rotation    Hip external rotation    Knee flexion    Knee extension    Ankle dorsiflexion 4   Ankle plantarflexion 4   Ankle inversion 4-   Ankle eversion 4-    (Blank rows = not  tested)  LOWER EXTREMITY SPECIAL TESTS:   FUNCTIONAL TESTS:   SLS: decreased time on R vs L with increased sway , 6 sec    TODAY'S TREATMENT:                                                                                                                              DATE:  11/06/22:  Ther ex: see below for HEP.    PATIENT EDUCATION:  Education details: PT POC, Exam findings, HEP Person educated: Patient Education method: Explanation, Demonstration, Tactile cues, Verbal cues, and Handouts Education comprehension: verbalized understanding, returned demonstration, verbal cues required, tactile cues required, and needs further education  HOME EXERCISE PROGRAM: Access Code: [email or web address] URL: [email or web address] Date: 11/06/2022 Prepared by: Sedalia Muta  Exercises - Sit to Stand  - 1 x daily - 1-2 sets - 10 reps - Ankle Eversion with Resistance  - 1 x daily - 1 sets - 10 reps - Supine Ankle Eversion AROM  - 1 x daily - 2 sets - 10 reps - Long Sitting Ankle Dorsiflexion AROM  - 2 x daily - 1 sets - 10 reps   ASSESSMENT:  CLINICAL IMPRESSION: Patient presents with primary complaint of increased pain in R ankle. She has improved swelling this week, but has had ongoing swelling for about 1 month. She has tenderness in lateral aspect of ankle. She does have postural deficits that are likely causing and/or contributing to pain in ankle. In standing, R hip is in ER, with R foot toeing out. She has decreased heel to toe mechanics. She also has weakness and instability in R LE, foot and ankle. Pt to benefit from skilled PT to improve deficits and pain.   OBJECTIVE IMPAIRMENTS: Abnormal gait, decreased activity tolerance, decreased balance, decreased knowledge of use of DME, decreased mobility, difficulty walking, decreased ROM, decreased strength, increased muscle spasms, impaired flexibility, improper body mechanics, postural dysfunction, and pain.   ACTIVITY LIMITATIONS:  bending, standing, squatting, stairs, transfers, and locomotion level  PARTICIPATION LIMITATIONS: meal prep, driving, shopping, community activity, occupation, and yard work  PERSONAL FACTORS: 1 comorbidity: posture/alignment  are also affecting patient's functional outcome.   REHAB POTENTIAL: Good  CLINICAL DECISION MAKING: Stable/uncomplicated  EVALUATION COMPLEXITY:  Low   GOALS: Goals reviewed with patient? Yes  SHORT TERM GOALS: Target date: 11/20/22  Pt to be independent with initial HEP  Goal status: INITIAL  2.  Pt to demo ability for optimal standing alignment with hips and foot in more neutral position.   Goal status: INITIAL   LONG TERM GOALS: Target date: 01/01/23  Pt to be independent with final HEP  Goal status: INITIAL  2.  Pt to report decreased pain in R ankle to 0-2/10 with standing and walking activity..   Goal status: INITIAL  3.  Pt to demo improved strength and stability of R foot/ankle and LE to be WNL for pt age.   Goal status: INITIAL  4.  Pt to demo improved gait pattern to be at max function and optimal mechanics for pt age, with decreased whip and toeing out on R foot.   Goal status: INITIAL   PLAN:  PT FREQUENCY: 1-2x/week  PT DURATION: 8 weeks  PLANNED INTERVENTIONS: Therapeutic exercises, Therapeutic activity, Neuromuscular re-education, Balance training, Gait training, Patient/Family education, Self Care, Joint mobilization, Joint manipulation, Stair training, Orthotic/Fit training, DME instructions, Aquatic Therapy, Dry Needling, Electrical stimulation, Spinal manipulation, Spinal mobilization, Cryotherapy, Moist heat, Taping, Traction, Ultrasound, Ionotophoresis 4mg /ml Dexamethasone, and Manual therapy  PLAN FOR NEXT SESSION:    Sedalia Muta, PT, DPT 4:45 PM  11/08/22   PHYSICAL THERAPY DISCHARGE SUMMARY  Visits from Start of Care: 1   Plan: Patient agrees to discharge.  Patient goals were not  met. Patient is being  discharged due to - pt did not return since last visit.     Sedalia Muta, PT, DPT 11:31 AM  04/29/23

## 2022-11-08 ENCOUNTER — Encounter: Payer: Self-pay | Admitting: Physical Therapy

## 2022-11-20 ENCOUNTER — Encounter: Payer: 59 | Admitting: Physical Therapy

## 2022-11-27 ENCOUNTER — Encounter: Payer: 59 | Admitting: Physical Therapy

## 2022-12-03 ENCOUNTER — Other Ambulatory Visit: Payer: Self-pay | Admitting: Family Medicine

## 2022-12-04 ENCOUNTER — Encounter: Payer: 59 | Admitting: Physical Therapy

## 2022-12-11 ENCOUNTER — Encounter: Payer: 59 | Admitting: Physical Therapy

## 2023-05-21 ENCOUNTER — Other Ambulatory Visit: Payer: Self-pay | Admitting: Family Medicine

## 2023-06-18 LAB — HM PAP SMEAR

## 2023-06-18 LAB — HM MAMMOGRAPHY: HM Mammogram: NORMAL (ref 0–4)

## 2023-09-10 ENCOUNTER — Encounter: Payer: 59 | Admitting: Family Medicine

## 2023-09-14 ENCOUNTER — Ambulatory Visit (INDEPENDENT_AMBULATORY_CARE_PROVIDER_SITE_OTHER): Payer: 59 | Admitting: Family

## 2023-09-14 VITALS — BP 130/80 | HR 90 | Temp 98.0°F | Ht 63.0 in | Wt 203.2 lb

## 2023-09-14 DIAGNOSIS — B349 Viral infection, unspecified: Secondary | ICD-10-CM

## 2023-09-14 DIAGNOSIS — R6889 Other general symptoms and signs: Secondary | ICD-10-CM | POA: Diagnosis not present

## 2023-09-14 DIAGNOSIS — J9801 Acute bronchospasm: Secondary | ICD-10-CM | POA: Diagnosis not present

## 2023-09-14 LAB — POCT INFLUENZA A/B
Influenza A, POC: NEGATIVE
Influenza B, POC: NEGATIVE

## 2023-09-14 LAB — POC COVID19 BINAXNOW: SARS Coronavirus 2 Ag: NEGATIVE

## 2023-09-14 MED ORDER — ALBUTEROL SULFATE HFA 108 (90 BASE) MCG/ACT IN AERS
2.0000 | INHALATION_SPRAY | Freq: Four times a day (QID) | RESPIRATORY_TRACT | 0 refills | Status: DC | PRN
Start: 2023-09-14 — End: 2023-10-22

## 2023-09-14 MED ORDER — GUAIFENESIN-CODEINE 100-10 MG/5ML PO SOLN
5.0000 mL | Freq: Three times a day (TID) | ORAL | 0 refills | Status: DC | PRN
Start: 2023-09-14 — End: 2023-10-22

## 2023-09-14 NOTE — Progress Notes (Signed)
Patient ID: Dawn Roth, female    DOB: 1981-01-07, 42 y.o.   MRN: [id number]  Chief Complaint  Patient presents with   Cough    Pt c/o Dry cough, chest tightness, body aches, fatigue and chest congestion, Present since Sunday.  Son dx walking pneumonia yesterday.        Discussed the use of AI scribe software for clinical note transcription with the patient, who gave verbal consent to proceed.  History of Present Illness   The patient, with a history of bronchitis, presents with a cough and chest tightness that began on Sunday. She describes the cough as productive and wet. She denies fever but reports occasional chills. She has not experienced any shortness of breath or chest pain. She has a history of using an inhaler for bronchitis, but it has been years since she last needed it. She denies any history of asthma. She also reports a sensation of fullness in her ears, but denies any pain or discomfort. She has not been using any over-the-counter medications for her symptoms.     Assessment & Plan:     Upper Respiratory Infection - Coughing, tightness, and chills since Sunday. No fever. Slight wheeze on examination. No history of asthma, but has used inhaler with past bronchitis. -Prescribe Albuterol inhaler, use as needed for tightness and coughing. -Advise use of ibuprofen or Aleve for body aches and potential fever. -Recommend saline spray or neti pot for nasal care and prevention of sinus infection. -Advise use of humidifier overnight for added comfort and symptom relief. -Advise sleeping propped up to aid with drainage. -Take Vitamin C and Zinc for immune support. -Monitor symptoms and consider further treatment if condition worsens.     Subjective:    Outpatient Medications Prior to Visit  Medication Sig Dispense Refill   escitalopram (LEXAPRO) 10 MG tablet TAKE ONE TABLET BY MOUTH EVERY DAY 90 tablet 3   labetalol (NORMODYNE) 200 MG tablet TAKE ONE TABLET BY MOUTH TWICE DAILY  180 tablet 3   Multiple Vitamins-Minerals (MULTIVITAMIN ADULT PO) multivitamin     Norethin-Eth Estrad-Fe Biphas (LO LOESTRIN FE PO) Take by mouth.     predniSONE (DELTASONE) 10 MG tablet Take 4 tabs qd x 2 days, 3 qd x 2 days, 2 qd x 2d, 1qd x 3 days 21 tablet 0   No facility-administered medications prior to visit.   Past Medical History:  Diagnosis Date   Heart murmur    MITRAL VALUVE REGURITATION   Hypertension    chronic HTN, on meds   Past Surgical History:  Procedure Laterality Date   CESAREAN SECTION N/A 07/17/2015   Procedure: CESAREAN SECTION;  Surgeon: Candice Camp, MD;  Location: [facility];  Service: Obstetrics;  Laterality: N/A;  Primary edc 07/14/15 nkda   CESAREAN SECTION WITH BILATERAL TUBAL LIGATION Bilateral 11/17/2017   Procedure: CESAREAN SECTION WITH BILATERAL TUBAL LIGATION;  Surgeon: Zelphia Cairo, MD;  Location: [facility];  Service: Obstetrics;  Laterality: Bilateral;  Repeat edc 11/27/17 NKDA Tracey RNFA   CHOLECYSTECTOMY  09/2011   No Known Allergies    Objective:    Physical Exam Vitals and nursing note reviewed.  Constitutional:      Appearance: Normal appearance. She is ill-appearing.     Interventions: Face mask in place.  HENT:     Right Ear: Tympanic membrane and ear canal normal.     Left Ear: Tympanic membrane and ear canal normal.     Nose:     Right Sinus:  Frontal sinus tenderness present.     Left Sinus: Frontal sinus tenderness present.     Mouth/Throat:     Mouth: Mucous membranes are moist.     Pharynx: Posterior oropharyngeal erythema present. No pharyngeal swelling, oropharyngeal exudate or uvula swelling.     Tonsils: No tonsillar exudate or tonsillar abscesses.  Cardiovascular:     Rate and Rhythm: Normal rate and regular rhythm.  Pulmonary:     Effort: Pulmonary effort is normal.     Breath sounds: Examination of the right-upper field reveals wheezing. Examination of the left-upper field reveals wheezing. Wheezing  (mild) present.  Musculoskeletal:        General: Normal range of motion.  Lymphadenopathy:     Head:     Right side of head: No preauricular or posterior auricular adenopathy.     Left side of head: No preauricular or posterior auricular adenopathy.     Cervical: No cervical adenopathy.  Skin:    General: Skin is warm and dry.  Neurological:     Mental Status: She is alert.  Psychiatric:        Mood and Affect: Mood normal.        Behavior: Behavior normal.    BP 130/80 (BP Location: Left Arm, Patient Position: Sitting, Cuff Size: Large)   Pulse 90   Temp 98 F (36.7 C) (Temporal)   Ht 5\' 3"  (1.6 m)   Wt 203 lb 3.2 oz (92.2 kg)   SpO2 97%   BMI 36.00 kg/m  Wt Readings from Last 3 Encounters:  09/14/23 203 lb 3.2 oz (92.2 kg)  10/23/22 201 lb 3.2 oz (91.3 kg)  09/02/22 200 lb 9.6 oz (91 kg)      Dulce Sellar, NP

## 2023-10-22 ENCOUNTER — Ambulatory Visit (INDEPENDENT_AMBULATORY_CARE_PROVIDER_SITE_OTHER): Payer: 59 | Admitting: Family Medicine

## 2023-10-22 ENCOUNTER — Encounter: Payer: Self-pay | Admitting: Family Medicine

## 2023-10-22 VITALS — BP 138/88 | HR 89 | Temp 98.1°F | Ht 63.0 in | Wt 196.2 lb

## 2023-10-22 DIAGNOSIS — Z3041 Encounter for surveillance of contraceptive pills: Secondary | ICD-10-CM

## 2023-10-22 DIAGNOSIS — I10 Essential (primary) hypertension: Secondary | ICD-10-CM | POA: Diagnosis not present

## 2023-10-22 DIAGNOSIS — Z0000 Encounter for general adult medical examination without abnormal findings: Secondary | ICD-10-CM | POA: Diagnosis not present

## 2023-10-22 DIAGNOSIS — N924 Excessive bleeding in the premenopausal period: Secondary | ICD-10-CM | POA: Diagnosis not present

## 2023-10-22 DIAGNOSIS — Z0001 Encounter for general adult medical examination with abnormal findings: Secondary | ICD-10-CM

## 2023-10-22 DIAGNOSIS — F4322 Adjustment disorder with anxiety: Secondary | ICD-10-CM | POA: Diagnosis not present

## 2023-10-22 LAB — COMPREHENSIVE METABOLIC PANEL
ALT: 24 U/L (ref 0–35)
AST: 24 U/L (ref 0–37)
Albumin: 4.4 g/dL (ref 3.5–5.2)
Alkaline Phosphatase: 76 U/L (ref 39–117)
BUN: 13 mg/dL (ref 6–23)
CO2: 27 meq/L (ref 19–32)
Calcium: 9.5 mg/dL (ref 8.4–10.5)
Chloride: 101 meq/L (ref 96–112)
Creatinine, Ser: 0.77 mg/dL (ref 0.40–1.20)
GFR: 94.38 mL/min (ref >60.00)
Glucose, Bld: 92 mg/dL (ref 70–99)
Potassium: 3.9 meq/L (ref 3.5–5.1)
Sodium: 137 meq/L (ref 135–145)
Total Bilirubin: 0.3 mg/dL (ref 0.2–1.2)
Total Protein: 7.6 g/dL (ref 6.0–8.3)

## 2023-10-22 LAB — LIPID PANEL
Cholesterol: 185 mg/dL (ref 0–200)
HDL: 54.2 mg/dL (ref >39.00)
LDL Cholesterol: 102 mg/dL — ABNORMAL HIGH (ref 0–99)
NonHDL: 130.6
Total CHOL/HDL Ratio: 3
Triglycerides: 144 mg/dL (ref 0.0–149.0)
VLDL: 28.8 mg/dL (ref 0.0–40.0)

## 2023-10-22 LAB — TSH: TSH: 1.86 u[IU]/mL (ref 0.35–5.50)

## 2023-10-22 LAB — CBC WITH DIFFERENTIAL/PLATELET
Basophils Absolute: 0 10*3/uL (ref 0.0–0.1)
Basophils Relative: 0.9 % (ref 0.0–3.0)
Eosinophils Absolute: 0.2 10*3/uL (ref 0.0–0.7)
Eosinophils Relative: 3.1 % (ref 0.0–5.0)
HCT: 41.3 % (ref 36.0–46.0)
Hemoglobin: 13.7 g/dL (ref 12.0–15.0)
Lymphocytes Relative: 27.6 % (ref 12.0–46.0)
Lymphs Abs: 1.4 10*3/uL (ref 0.7–4.0)
MCHC: 33.1 g/dL (ref 30.0–36.0)
MCV: 90.1 fL (ref 78.0–100.0)
Monocytes Absolute: 0.4 10*3/uL (ref 0.1–1.0)
Monocytes Relative: 7.6 % (ref 3.0–12.0)
Neutro Abs: 3.1 10*3/uL (ref 1.4–7.7)
Neutrophils Relative %: 60.8 % (ref 43.0–77.0)
Platelets: 267 10*3/uL (ref 150.0–400.0)
RBC: 4.58 Mil/uL (ref 3.87–5.11)
RDW: 13.8 % (ref 11.5–15.5)
WBC: 5.1 10*3/uL (ref 4.0–10.5)

## 2023-10-22 NOTE — Progress Notes (Signed)
Subjective  Chief Complaint  Patient presents with   Annual Exam   Hypertension    HPI: Dawn Roth is a 43 y.o. female who presents to [facility] [place] today for a Female Wellness Visit.  She also has the concerns and/or needs as listed above in the chief complaint. These will be addressed in addition to the Health Maintenance Visit.   Wellness Visit: annual visit with health maintenance review and exam HM: pap and mammo nl in September 2024 dr. Renaldo Fiddler office. Called for records. See care everywhere as well. Deferred flu vaccine this year. Married, sons are 6 and 8. Works full time. Doing well overall but BUSY! Chronic disease management visit and/or acute problem visit: Discussed the use of AI scribe software for clinical note transcription with the patient, who gave verbal consent to proceed.  History of Present Illness   The patient, with a history of hypertension and on Lexapro for mental health, presents with concerns about recent elevated blood pressure readings. She reports that during a recent illness, characterized by double ear infections, her blood pressure was noted to be high at an urgent care visit. The patient describes the ear infections as causing significant discomfort, including a pounding sensation in the head and neck, and stabbing pain in the head. She also reports a history of recurrent illnesses in the past few months, including a stomach bug and walking pneumonia. On labetalol x years. Has been well controlled. Borderline reading in office today. Weight is stable but overweight. Little exercise. No cp or sob or edema.  The patient also mentions a recent increase in stress due to family issues and the holiday season. She reports feeling cranky and hoping that returning to a regular routine would help level out her mood. She also mentions a history of difficult periods, which have been managed with birth control despite having had a tubal ligation.  The patient is currently feeling recovered from her recent illnesses, but still feels a bit tired. She has been adherent to her current medication regimen, including Lexapro and a blood pressure medication.      Assessment  1. Encounter for well adult exam with abnormal findings   2. Essential hypertension   3. Adjustment disorder with anxiety   4. Oral contraceptive use   5. Excessive bleeding in premenopausal period      Plan  Female Wellness Visit: Age appropriate Health Maintenance and Prevention measures were discussed with patient. Included topics are cancer screening recommendations, ways to keep healthy (see AVS) including dietary and exercise recommendations, regular eye and dental care, use of seat belts, and avoidance of moderate alcohol use and tobacco use. Get records for pap andmammo, both screens are current BMI: discussed patient's BMI and encouraged positive lifestyle modifications to help get to or maintain a target BMI. HM needs and immunizations were addressed and ordered. See below for orders. See HM and immunization section for updates. Routine labs and screening tests ordered including cmp, cbc and lipids where appropriate. Discussed recommendations regarding Vit D and calcium supplementation (see AVS)  Chronic disease f/u and/or acute problem visit: (deemed necessary to be done in addition to the wellness visit): Assessment and Plan    Hypertension Intermittent elevated blood pressure readings, particularly during illness and stress. Current medication regimen may need adjustment. Patient has not been consistently monitoring blood pressure at home. Discussed importance of regular monitoring and potential need for medication adjustment. Adding a diuretic could help manage blood pressure if  lifestyle modifications are insufficient. - Advise low-salt diet - Instruct to periodically check blood pressure at home - Monitor blood pressure and report if consistently high -  Consider adding a diuretic if blood pressure remains elevated - monitor renal function and lytes today and fasting lipids. Check tsh  Depression Increased irritability and stress during holidays, attributed to situational factors. Currently on Lexapro with overall stability when on a regular schedule. Discussed importance of maintaining a routine to manage symptoms effectively. - Continue Lexapro 10 daily. Control is good typically - Monitor mood and stress levels, especially as routine normalizes  Menorrhagia Prolonged and heavy menstrual periods managed with birth control pills, which have been effective. Discussed benefits of continuing birth control to manage menorrhagia and prevent recurrence. - Continue birth control pills  Double Ear Infections Recent double ear infections treated at urgent care with resolution of symptoms. - No further action required  General Health Maintenance Up to date on Pap smear and mammogram as of September. No flu shot received this year. Pneumonia vaccine not indicated. - Encourage flu vaccination  Follow-up 12 mo for cpe - Send refill request if needed - Follow up with primary care provider if blood pressure remains high.       Orders Placed This Encounter  Procedures   HM MAMMOGRAPHY   CBC with Differential/Platelet   Comprehensive metabolic panel   Lipid panel   TSH   HM PAP SMEAR   No orders of the defined types were placed in this encounter.      Body mass index is 34.76 kg/m. Wt Readings from Last 3 Encounters:  10/22/23 196 lb 3.2 oz (89 kg)  09/14/23 203 lb 3.2 oz (92.2 kg)  10/23/22 201 lb 3.2 oz (91.3 kg)     Patient Active Problem List   Diagnosis Date Noted Date Diagnosed   Excessive bleeding in premenopausal period 10/22/2023    Spouse of alcoholic husband 16/06/9603    Obesity (BMI 30-39.9) 01/08/2021    Adjustment disorder with anxiety 07/26/2020     Work related stress: started lexapro 05/2020, weaning trial  11/2021    Dermatofibroma 05/13/2016    Oral contraceptive use 11/15/2015    Essential hypertension 06/09/2013    Mitral valve regurgitation 04/28/2013     ECHO - mild - moderate, Dr. Leeann Must ECHO 07/2018, mild and stable.     Health Maintenance  Topic Date Due   INFLUENZA VACCINE  12/27/2023 (Originally 04/29/2023)   DTaP/Tdap/Td (3 - Td or Tdap) 06/28/2025   Cervical Cancer Screening (HPV/Pap Cotest)  06/17/2028   Hepatitis C Screening  Completed   HIV Screening  Completed   HPV VACCINES  Aged Out   Pneumococcal Vaccine 62-31 Years old  Discontinued   COVID-19 Vaccine  Discontinued   Immunization History  Administered Date(s) Administered   Hepatitis B, ADULT 01/02/2014   Hepatitis B, PED/ADOLESCENT 01/02/2014   Influenza, Quadrivalent, Recombinant, Inj, Pf 09/08/2013   Influenza, Seasonal, Injecte, Preservative Fre 06/29/2015   Influenza,inj,Quad PF,6+ Mos 06/29/2018, 06/02/2019, 06/07/2020, 06/23/2021, 09/26/2022   Influenza-Unspecified 06/29/2015   Moderna Sars-Covid-2 Vaccination 12/30/2019, 01/20/2020   Tdap 03/28/2006, 06/29/2015   We updated and reviewed the patient's past history in detail and it is documented below. Allergies: Patient  reports current alcohol use. Past Medical History Patient  has a past medical history of Heart murmur and Hypertension. Past Surgical History Patient  has a past surgical history that includes Cholecystectomy (09/2011); Cesarean section (N/A, 07/17/2015); and Cesarean section with bilateral tubal ligation (Bilateral, 11/17/2017).  Social History   Socioeconomic History   Marital status: Married    Spouse name: Not on file   Number of children: 2   Years of education: Not on file   Highest education level: Not on file  Occupational History   Occupation: DENTAL ASSISTANCE    Employer: [employer]  Tobacco Use   Smoking status: Never   Smokeless tobacco: Never  Vaping Use   Vaping status: Never Used  Substance and  Sexual Activity   Alcohol use: Yes   Drug use: No   Sexual activity: Yes    Birth control/protection: Surgical  Other Topics Concern   Not on file  Social History Narrative   Not on file   Social Drivers of Health   Financial Resource Strain: Not on file  Food Insecurity: Not on file  Transportation Needs: Not on file  Physical Activity: Not on file  Stress: Not on file  Social Connections: Not on file   Family History  Problem Relation Age of Onset   Heart attack Father 48       passed sept 2022; MI   Hypertension Father    Alcohol abuse Father    Arthritis Father    Depression Sister    Anxiety disorder Sister    Healthy Son    Healthy Son    Heart disease Paternal Aunt    Liver cancer Maternal Grandmother    Learning disabilities Maternal Grandfather    Brain cancer Paternal Grandmother    Heart attack Paternal Grandfather     Review of Systems: Constitutional: negative for fever or malaise Ophthalmic: negative for photophobia, double vision or loss of vision Cardiovascular: negative for chest pain, dyspnea on exertion, or new LE swelling Respiratory: negative for SOB or persistent cough Gastrointestinal: negative for abdominal pain, change in bowel habits or melena Genitourinary: negative for dysuria or gross hematuria, no abnormal uterine bleeding or disharge Musculoskeletal: negative for new gait disturbance or muscular weakness Integumentary: negative for new or persistent rashes, no breast lumps Neurological: negative for TIA or stroke symptoms Psychiatric: negative for SI or delusions Allergic/Immunologic: negative for hives  Patient Care Team    Relationship Specialty Notifications Start End  Willow Ora, MD PCP - General Family Medicine  02/04/18     Objective  Vitals: BP 138/88   Pulse 89   Temp 98.1 F (36.7 C)   Ht 5\' 3"  (1.6 m)   Wt 196 lb 3.2 oz (89 kg)   SpO2 96%   BMI 34.76 kg/m  General:  Well developed, well nourished, no acute  distress  Psych:  Alert and orientedx3,normal mood and affect HEENT:  Normocephalic, atraumatic, non-icteric sclera, PERRL, supple neck without adenopathy, mass or thyromegaly Cardiovascular:  Normal S1, S2, RRR without gallop, rub or murmur Respiratory:  Good breath sounds bilaterally, CTAB with normal respiratory effort Gastrointestinal: normal bowel sounds, soft, non-tender, no noted masses. No HSM MSK: no deformities, contusions. Joints are without erythema or swelling.  Skin:  Warm, no rashes or suspicious lesions noted Neurologic:    Mental status is normal. Gross motor and sensory exams are normal. Normal gait. No tremor    Commons side effects, risks, benefits, and alternatives for medications and treatment plan prescribed today were discussed, and the patient expressed understanding of the given instructions. Patient is instructed to call or message via MyChart if he/she has any questions or concerns regarding our treatment plan. No barriers to understanding were identified. We discussed Red Flag symptoms and  signs in detail. Patient expressed understanding regarding what to do in case of urgent or emergency type symptoms.  Medication list was reconciled, printed and provided to the patient in AVS. Patient instructions and summary information was reviewed with the patient as documented in the AVS. This note was prepared with assistance of Dragon voice recognition software. Occasional wrong-word or sound-a-like substitutions may have occurred due to the inherent limitations of voice recognition software .

## 2023-10-22 NOTE — Patient Instructions (Signed)
Please return in 12 months for your annual complete physical; please come fasting.   I will release your lab results to you on your MyChart account with further instructions. You may see the results before I do, but when I review them I will send you a message with my report or have my assistant call you if things need to be discussed. Please reply to my message with any questions. Thank you!   If you have any questions or concerns, please don't hesitate to send me a message via MyChart or call the office at [phone]. Thank you for visiting with Dawn Roth today! It's our pleasure caring for you.   VISIT SUMMARY:  During today's visit, we discussed your recent health concerns, including elevated blood pressure readings, increased stress, and a history of recurrent illnesses. We also reviewed your current medications and overall health maintenance.  YOUR PLAN:  -HYPERTENSION: Hypertension means high blood pressure, which can be influenced by stress and illness. We discussed the importance of regularly monitoring your blood pressure at home and maintaining a low-salt diet. If your blood pressure remains high, we may consider adding a diuretic to your treatment plan. Please send me a message with your readings.   -DEPRESSION/stress reaction: Depression is a mental health condition that can cause irritability and stress, especially during challenging times. You are currently taking Lexapro, which helps manage your symptoms. It's important to maintain a regular routine to help keep your mood stable.  -MENORRHAGIA: Menorrhagia refers to heavy and prolonged menstrual periods. Birth control pills have been effective in managing this condition for you, and we recommend continuing with them to prevent recurrence.  -DOUBLE EAR INFECTIONS: You recently had double ear infections, which have been treated successfully. No further action is required at this time.  -GENERAL HEALTH MAINTENANCE: You are up to date on  your Pap smear and mammogram as of September. However, you have not received a flu shot this year. We encourage you to get a flu vaccination.  INSTRUCTIONS:  Please monitor your blood pressure at home and report if it remains consistently high. Continue taking your current medications as prescribed. If you need a refill, let Dawn Roth know. Follow up with your primary care provider if your blood pressure remains elevated.

## 2023-11-01 ENCOUNTER — Encounter: Payer: Self-pay | Admitting: Family Medicine

## 2023-11-01 NOTE — Progress Notes (Signed)
See mychart note Dear Ms. Poorman, These results all look fine.  Take care! Sincerely, Dr. Mardelle Matte

## 2023-11-29 ENCOUNTER — Encounter: Payer: Self-pay | Admitting: Family Medicine

## 2023-12-02 ENCOUNTER — Other Ambulatory Visit: Payer: Self-pay | Admitting: Family Medicine

## 2023-12-02 ENCOUNTER — Encounter: Payer: Self-pay | Admitting: Family Medicine

## 2023-12-02 ENCOUNTER — Ambulatory Visit: Admitting: Family Medicine

## 2023-12-02 VITALS — BP 133/83 | HR 75 | Temp 98.4°F | Ht 63.0 in | Wt 199.4 lb

## 2023-12-02 DIAGNOSIS — J189 Pneumonia, unspecified organism: Secondary | ICD-10-CM

## 2023-12-02 DIAGNOSIS — R002 Palpitations: Secondary | ICD-10-CM | POA: Diagnosis not present

## 2023-12-02 DIAGNOSIS — J9801 Acute bronchospasm: Secondary | ICD-10-CM

## 2023-12-02 MED ORDER — GUAIFENESIN-CODEINE 100-10 MG/5ML PO SOLN: 5.0000 mL | Freq: Four times a day (QID) | ORAL | 0 refills | Status: DC | PRN

## 2023-12-02 MED ORDER — ALBUTEROL SULFATE HFA 108 (90 BASE) MCG/ACT IN AERS: 2.0000 | INHALATION_SPRAY | RESPIRATORY_TRACT | 2 refills | Status: DC | PRN

## 2023-12-02 NOTE — Patient Instructions (Addendum)
 Please return in 2 week for recheck   If you have any questions or concerns, please don't hesitate to send me a message via MyChart or call the office at [phone]. Thank you for visiting with [place] today! It's our pleasure caring for you.  VISIT SUMMARY:  Today, we discussed your recent respiratory symptoms and pneumonia diagnosis. You have been experiencing fatigue, cough, and difficulty breathing, but there has been some improvement. We reviewed your current medications and made some adjustments to help manage your symptoms better.  YOUR PLAN:  -PNEUMONIA: Pneumonia is an infection that inflames the air sacs in one or both lungs. You have a small pneumonia as seen on a CT scan. We will add an inhaler to help with wheezing and tightness, and Robitussin with codeine to manage your cough and aid in rest. Continue taking Augmentin as prescribed, stay hydrated, and get plenty of rest. We will schedule a follow-up visit in a few weeks to check on your recovery.  -GENERAL HEALTH MAINTENANCE: Gradually increase your activity as tolerated to regain your stamina. A work note has been provided as requested.  INSTRUCTIONS:  Please schedule a follow-up visit in a few weeks to assess your recovery from pneumonia.  For more information, you can read your full clinical note, available in your patient portal.

## 2023-12-02 NOTE — Progress Notes (Signed)
 Subjective  CC:  Chief Complaint  Patient presents with   Hospitalization Follow-up    11/28/2023 [facility] Emergency Department    Pneumonia    HPI: Dawn Roth is a 43 y.o. female who presents to the office today to address the problems listed above in the chief complaint. Discussed the use of AI scribe software for clinical note transcription with the patient, who gave verbal consent to proceed.  History of Present Illness   Dawn Roth is a 43 year old female who presents with respiratory symptoms and recent pneumonia diagnosis.  I reviewed recent hospital records including all notes, lab results, chest x-ray and CT scan reports.  Of note, small right lower lobe pneumonia by CT scan, not visible on chest x-ray, positive D-dimer with negative pulmonary embolism on CT scan.  Labs otherwise unremarkable  Her respiratory symptoms began last week, initially resembling a head cold with body aches and flu-like symptoms. She tested negative for influenza and COVID-19. Her condition worsened by Thursday, leading to a day of rest. By Saturday, she was bedridden, prompting a visit to urgent care where she tested positive for strep throat and was prescribed Augmentin and prednisone. Despite starting Augmentin, her symptoms persisted, and by Sunday, she experienced significant difficulty breathing and a racing heart, leading to an emergency room visit. A chest x-ray did not show pneumonia, but a CT scan revealed a small pneumonia.  She feels very tired and has difficulty breathing, though there is some improvement in her ability to breathe and less wheezing at night. She has not been using an inhaler recently but has used one in the past for bronchitis. Her cough is present when not still, and she has been using Nyquil to aid sleep. Her sleep has improved slightly over the last two nights, though she still experiences some coughing.  She has been drinking plenty of fluids but has not been eating much.  She has been out of work for the past week and is uncertain about returning next week, as she still feels fatigued and lacks energy. She has been trying to maintain some activity by walking around her yard and chicken coop, but she notes that overexertion leads to increased fatigue the following day. She wants to return to her usual level of activity but acknowledges her current limitations.       Assessment  1. Community acquired pneumonia of right lower lobe of lung   2. Bronchospasm   3. Palpitations      Plan  Assessment and Plan    Pneumonia w/ bronchospasm Recent diagnosis of pneumonia with ongoing symptoms of fatigue, cough, and wheezing. Chest X-ray did not show pneumonia, but CT scan showed a small pneumonia. Patient is currently on Augmentin and has discontinued Prednisone due to intolerance. su -Add inhaler to manage wheezing and tightness. -Add Robitussin with codeine to manage cough and aid in rest. -Continue Augmentin as prescribed. -Encourage hydration and rest. -Schedule follow-up visit in a few weeks to assess recovery.  Strep Throat: suspect false positive rapid strep test.   General Health Maintenance -Encourage patient to gradually increase activity as tolerated to regain stamina. -Provide work note as requested by patient.      No orders of the defined types were placed in this encounter.  Meds ordered this encounter  Medications   albuterol (VENTOLIN HFA) 108 (90 Base) MCG/ACT inhaler    Sig: Inhale 2 puffs into the lungs every 4 (four) hours as needed for wheezing or shortness of  breath.    Dispense:  1 each    Refill:  2   guaiFENesin-codeine 100-10 MG/5ML syrup    Sig: Take 5 mLs by mouth every 6 (six) hours as needed for cough.    Dispense:  120 mL    Refill:  0     I reviewed the patients updated PMH, FH, and SocHx.    Patient Active Problem List   Diagnosis Date Noted   Excessive bleeding in premenopausal period 10/22/2023   Spouse of  alcoholic husband 40/98/1191   Obesity (BMI 30-39.9) 01/08/2021   Adjustment disorder with anxiety 07/26/2020   Dermatofibroma 05/13/2016   Oral contraceptive use 11/15/2015   Essential hypertension 06/09/2013   Mitral valve regurgitation 04/28/2013   Current Meds  Medication Sig   albuterol (VENTOLIN HFA) 108 (90 Base) MCG/ACT inhaler Inhale 2 puffs into the lungs every 4 (four) hours as needed for wheezing or shortness of breath.   amoxicillin-clavulanate (AUGMENTIN) 875-125 MG tablet Take by mouth.   escitalopram (LEXAPRO) 10 MG tablet TAKE ONE TABLET BY MOUTH EVERY DAY   guaiFENesin-codeine 100-10 MG/5ML syrup Take 5 mLs by mouth every 6 (six) hours as needed for cough.   Multiple Vitamins-Minerals (MULTIVITAMIN ADULT PO) multivitamin   Norethin-Eth Estrad-Fe Biphas (LO LOESTRIN FE PO) Take by mouth.   [DISCONTINUED] labetalol (NORMODYNE) 200 MG tablet TAKE ONE TABLET BY MOUTH TWICE DAILY    Allergies: Patient has no known allergies. Family History: Patient family history includes Alcohol abuse in her father; Anxiety disorder in her sister; Arthritis in her father; Brain cancer in her paternal grandmother; Depression in her sister; Healthy in her son and son; Heart attack in her paternal grandfather; Heart attack (age of onset: 57) in her father; Heart disease in her paternal aunt; Hypertension in her father; Learning disabilities in her maternal grandfather; Liver cancer in her maternal grandmother. Social History:  Patient  reports that she has never smoked. She has never used smokeless tobacco. She reports current alcohol use. She reports that she does not use drugs.  Review of Systems: Constitutional: Negative for fever malaise or anorexia Cardiovascular: negative for chest pain Respiratory: negative for SOB or persistent cough Gastrointestinal: negative for abdominal pain  Objective  Vitals: BP 133/83   Pulse 75   Temp 98.4 F (36.9 C)   Ht 5\' 3"  (1.6 m)   Wt 199 lb 6.4  oz (90.4 kg)   SpO2 94%   BMI 35.32 kg/m  General: no acute distress , A&Ox3 HEENT: PEERL, conjunctiva normal, neck is supple Cardiovascular:  RRR without murmur or gallop.  Respiratory:  Good breath sounds bilaterally, CTAB with normal respiratory effort Skin:  Warm, no rashes    Commons side effects, risks, benefits, and alternatives for medications and treatment plan prescribed today were discussed, and the patient expressed understanding of the given instructions. Patient is instructed to call or message via MyChart if he/she has any questions or concerns regarding our treatment plan. No barriers to understanding were identified. We discussed Red Flag symptoms and signs in detail. Patient expressed understanding regarding what to do in case of urgent or emergency type symptoms.  Medication list was reconciled, printed and provided to the patient in AVS. Patient instructions and summary information was reviewed with the patient as documented in the AVS. This note was prepared with assistance of Dragon voice recognition software. Occasional wrong-word or sound-a-like substitutions may have occurred due to the inherent limitations of voice recognition software

## 2023-12-03 ENCOUNTER — Inpatient Hospital Stay: Admitting: Family Medicine

## 2023-12-17 ENCOUNTER — Ambulatory Visit: Admitting: Family Medicine

## 2024-03-13 ENCOUNTER — Encounter: Payer: Self-pay | Admitting: Family Medicine

## 2024-03-14 NOTE — Telephone Encounter (Signed)
LVM to schedule VV or OV

## 2024-03-20 ENCOUNTER — Telehealth (INDEPENDENT_AMBULATORY_CARE_PROVIDER_SITE_OTHER): Admitting: Family Medicine

## 2024-03-20 VITALS — Ht 63.0 in | Wt 190.0 lb

## 2024-03-20 DIAGNOSIS — Z3041 Encounter for surveillance of contraceptive pills: Secondary | ICD-10-CM | POA: Diagnosis not present

## 2024-03-20 DIAGNOSIS — F4322 Adjustment disorder with anxiety: Secondary | ICD-10-CM

## 2024-03-20 MED ORDER — ALPRAZOLAM 0.5 MG PO TABS: 0.5000 mg | ORAL_TABLET | Freq: Every day | ORAL | 0 refills | Status: DC | PRN

## 2024-03-20 MED ORDER — ESCITALOPRAM OXALATE 20 MG PO TABS: 20.0000 mg | ORAL_TABLET | Freq: Every day | ORAL | 3 refills | Status: AC

## 2024-03-20 NOTE — Progress Notes (Signed)
 Subjective  CC:  Chief Complaint  Patient presents with   medication questions    Lexapro    Virtual Visit via Video Note I connected with Dawn Roth on 03/20/24 at  4:00 PM EDT by a video enabled telemedicine application and verified that I am speaking with the correct person using two identifiers. Location patient: Home Location provider: [facility] Persons participating in the virtual visit: Klaudia Beirne, Lavern LITTIE Heck, MD Avelina Berber, CMA  I discussed the limitations of evaluation and management by telemedicine and the availability of in person appointments. The patient expressed understanding and agreed to proceed.  HPI: Dawn Roth is a 43 y.o. female who presents to the office today to address the problems listed above in the chief complaint, mood problems. 43 year old on Lexapro  10 daily for the last several years for adjustment anxiety.  Over the last several weeks to a few months, anxiety symptoms have been building.  She reports over the last week when she was on vacation with her children she was excessively irritable and did describe some mild panic attack like symptoms.  This is new for her.  She denies to depressive symptoms.  Lexapro  has worked well for her but does not seem to be holding her mood right now.  The worrisome thing to her is that her life is otherwise good.  She is not having any major new stressors.  Home life and work life are stable.  She is 43 years old and is on birth control pills.  She does think she is having some perimenopausal symptoms as well.  She wonders if the mood changes are hormonally related.    Assessment  1. Adjustment disorder with anxiety   2. Oral contraceptive use      Plan  Anxiety  with panic symptoms: Counseling education given.  Xanax if needed.  Increase Lexapro  to 20 and recheck in 6 to 8 weeks.  Patient agrees Continue birth control Counseling given: pt was instructed to contact office,  on-call physician or crisis Hotline if symptoms worsen significantly. If patient develops any suicidal or homicidal thoughts, she is directed to the ER immediately.   Follow up: 6 to 8 weeks to recheck mood No orders of the defined types were placed in this encounter.  Meds ordered this encounter  Medications   escitalopram  (LEXAPRO ) 20 MG tablet    Sig: Take 1 tablet (20 mg total) by mouth daily.    Dispense:  90 tablet    Refill:  3   ALPRAZolam (XANAX) 0.5 MG tablet    Sig: Take 1 tablet (0.5 mg total) by mouth daily as needed for anxiety (or panic).    Dispense:  30 tablet    Refill:  0      I reviewed the patients updated PMH, FH, and SocHx.    Patient Active Problem List   Diagnosis Date Noted   Excessive bleeding in premenopausal period 10/22/2023   Spouse of alcoholic husband 87/93/7976   Obesity (BMI 30-39.9) 01/08/2021   Adjustment disorder with anxiety 07/26/2020   Dermatofibroma 05/13/2016   Oral contraceptive use 11/15/2015   Essential hypertension 06/09/2013   Mitral valve regurgitation 04/28/2013   Current Meds  Medication Sig   ALPRAZolam (XANAX) 0.5 MG tablet Take 1 tablet (0.5 mg total) by mouth daily as needed for anxiety (or panic).   labetalol  (NORMODYNE ) 200 MG tablet TAKE ONE TABLET BY MOUTH TWICE DAILY   Multiple Vitamins-Minerals (MULTIVITAMIN ADULT PO) multivitamin  Norethin-Eth Estrad-Fe Biphas (LO LOESTRIN FE PO) Take by mouth.   [DISCONTINUED] escitalopram  (LEXAPRO ) 10 MG tablet TAKE ONE TABLET BY MOUTH EVERY DAY    Allergies: Patient has no known allergies. Family history:  Patient family history includes Alcohol abuse in her father; Anxiety disorder in her sister; Arthritis in her father; Brain cancer in her paternal grandmother; Depression in her sister; Healthy in her son and son; Heart attack in her paternal grandfather; Heart attack (age of onset: 21) in her father; Heart disease in her paternal aunt; Hypertension in her father; Learning  disabilities in her maternal grandfather; Liver cancer in her maternal grandmother. Social History   Socioeconomic History   Marital status: Married    Spouse name: Not on file   Number of children: 2   Years of education: Not on file   Highest education level: Bachelor's degree (e.g., BA, AB, BS)  Occupational History   Occupation: DENTAL ASSISTANCE    Employer: [employer]  Tobacco Use   Smoking status: Never   Smokeless tobacco: Never  Vaping Use   Vaping status: Never Used  Substance and Sexual Activity   Alcohol use: Yes   Drug use: No   Sexual activity: Yes    Birth control/protection: Surgical  Other Topics Concern   Not on file  Social History Narrative   Not on file   Social Drivers of Health   Financial Resource Strain: Medium Risk (03/18/2024)   Overall Financial Resource Strain (CARDIA)    Difficulty of Paying Living Expenses: Somewhat hard  Food Insecurity: No Food Insecurity (03/18/2024)   Hunger Vital Sign    Worried About Running Out of Food in the Last Year: Never true    Ran Out of Food in the Last Year: Never true  Transportation Needs: No Transportation Needs (03/18/2024)   PRAPARE - Administrator, Civil Service (Medical): No    Lack of Transportation (Non-Medical): No  Physical Activity: Insufficiently Active (03/18/2024)   Exercise Vital Sign    Days of Exercise per Week: 1 day    Minutes of Exercise per Session: 30 min  Stress: Stress Concern Present (03/18/2024)   [employer] of Occupational Health - Occupational Stress Questionnaire    Feeling of Stress: Rather much  Social Connections: Unknown (03/18/2024)   Social Connection and Isolation Panel    Frequency of Communication with Friends and Family: Once a week    Frequency of Social Gatherings with Friends and Family: Patient declined    Attends Religious Services: More than 4 times per year    Active Member of [employer] or Organizations: No    Attends Museum/gallery exhibitions officer: Not on file    Marital Status: Married     Review of Systems: Constitutional: Negative for fever malaise or anorexia Cardiovascular: negative for chest pain Respiratory: negative for SOB or persistent cough Gastrointestinal: negative for abdominal pain  Objective  Vitals: Ht 5' 3 (1.6 m)   Wt 190 lb (86.2 kg)   BMI 33.66 kg/m  General: no acute distress, well appearing, no apparent distress, well groomed Psych:  Alert and oriented x 3,. normal affect   Commons side effects, risks, benefits, and alternatives for medications and treatment plan prescribed today were discussed, and the patient expressed understanding of the given instructions. Patient is instructed to call or message via MyChart if he/she has any questions or concerns regarding our treatment plan. No barriers to understanding were identified. We discussed Red Flag symptoms and signs  in detail. Patient expressed understanding regarding what to do in case of urgent or emergency type symptoms.  Medication list was reconciled, printed and provided to the patient in AVS. Patient instructions and summary information was reviewed with the patient as documented in the AVS. This note was prepared with assistance of Dragon voice recognition software. Occasional wrong-word or sound-a-like substitutions may have occurred due to the inherent limitations of voice recognition software

## 2024-04-07 ENCOUNTER — Ambulatory Visit: Admitting: Family Medicine

## 2024-05-02 ENCOUNTER — Encounter: Payer: Self-pay | Admitting: Family Medicine

## 2024-05-02 ENCOUNTER — Telehealth (INDEPENDENT_AMBULATORY_CARE_PROVIDER_SITE_OTHER): Admitting: Family Medicine

## 2024-05-02 VITALS — Ht 63.0 in | Wt 200.0 lb

## 2024-05-02 DIAGNOSIS — F4322 Adjustment disorder with anxiety: Secondary | ICD-10-CM

## 2024-05-02 NOTE — Progress Notes (Signed)
 Virtual Visit via Video Note  SUBJECTIVE CC:  Chief Complaint  Patient presents with   Anxiety    Pt stated that she ois doing much better. She might was just going thru a melt down.    I connected with Alan Sink on 05/02/24 at  9:00 AM EDT by a video enabled telemedicine application and verified that I am speaking with the correct person using two identifiers. Location patient: Home Location provider: [facility] Persons participating in the virtual visit: [name], Lavern LITTIE Heck, MD Avelina Hastings, CMA   I discussed the limitations of evaluation and management by telemedicine and the availability of in person appointments. The patient expressed understanding and agreed to proceed.  HPI: Dawn Roth is a 43 y.o. female who was contacted today to address the problems listed above in the chief complaint/mood. See last note, we increase Lexapro  from 10 mg to 20 mg daily due to symptoms of anxiety and some early panic symptoms.  No known triggers.  Fortunately, she has improved.  Feeling more like herself.  Functioning daily and coping with parenting skills more efficiently.  No more panic attacks.  Only needed Xanax  less than a handful of times and it did work well for her.  No adverse effects from the Lexapro .    05/02/2024    9:07 AM 03/20/2024    4:03 PM 12/02/2023   10:04 AM  Depression screen PHQ 2/9  Decreased Interest 0 0 0  Down, Depressed, Hopeless 0 0 0  PHQ - 2 Score 0 0 0  Altered sleeping   0  Tired, decreased energy   0  Change in appetite   0  Feeling bad or failure about yourself    0  Trouble concentrating   0  Moving slowly or fidgety/restless   0  PHQ-9 Score   0      05/02/2024    9:07 AM 03/20/2024    4:03 PM 10/22/2023    8:48 AM 01/08/2021   11:26 AM  GAD 7 : Generalized Anxiety Score  Nervous, Anxious, on Edge 0 2 1 1   Control/stop worrying 0 1 0 0  Worry too much - different things 0 1 1 0  Trouble relaxing 0  2 0 0  Restless 0 3 0 1  Easily annoyed or irritable 0 3 0 0  Afraid - awful might happen 0 1 0 0  Total GAD 7 Score 0 13 2 2   Anxiety Difficulty Not difficult at all Somewhat difficult Not difficult at all Not difficult at all    ASSESSMENT 1. Adjustment disorder with anxiety     Improved. Continue lexapro  20.  Counseling done  I discussed the assessment and treatment plan with the patient. The patient was provided an opportunity to ask questions and all were answered. The patient agreed with the plan and demonstrated an understanding of the instructions.   The patient was advised to call back or seek an in-person evaluation if the symptoms worsen or if the condition fails to improve as anticipated. Follow up: as scheduled for cpe  10/27/2024  No orders of the defined types were placed in this encounter.     I reviewed the patients updated PMH, FH, and SocHx.    Patient Active Problem List   Diagnosis Date Noted   Excessive bleeding in premenopausal period 10/22/2023   Spouse of alcoholic husband 87/93/7976   Obesity (BMI 30-39.9) 01/08/2021   Adjustment disorder  with anxiety 07/26/2020   Dermatofibroma 05/13/2016   Oral contraceptive use 11/15/2015   Essential hypertension 06/09/2013   Mitral valve regurgitation 04/28/2013   Current Meds  Medication Sig   ALPRAZolam  (XANAX ) 0.5 MG tablet Take 1 tablet (0.5 mg total) by mouth daily as needed for anxiety (or panic).   escitalopram  (LEXAPRO ) 20 MG tablet Take 1 tablet (20 mg total) by mouth daily.   labetalol  (NORMODYNE ) 200 MG tablet TAKE ONE TABLET BY MOUTH TWICE DAILY   Multiple Vitamins-Minerals (MULTIVITAMIN ADULT PO) multivitamin   Norethin-Eth Estrad-Fe Biphas (LO LOESTRIN FE PO) Take by mouth.    Allergies: Patient has no known allergies. Family History: Patient family history includes Alcohol abuse in her father; Anxiety disorder in her sister; Arthritis in her father; Brain cancer in her paternal grandmother;  Depression in her sister; Healthy in her son and son; Heart attack in her paternal grandfather; Heart attack (age of onset: 56) in her father; Heart disease in her paternal aunt; Hypertension in her father; Learning disabilities in her maternal grandfather; Liver cancer in her maternal grandmother. Social History:  Patient  reports that she has never smoked. She has never used smokeless tobacco. She reports current alcohol use. She reports that she does not use drugs.  Review of Systems: Constitutional: Negative for fever malaise or anorexia Cardiovascular: negative for chest pain Respiratory: negative for SOB or persistent cough Gastrointestinal: negative for abdominal pain  OBJECTIVE/OBSERVATIONS: General: no acute distress, well appearing, no apparent distress, well groomed Psych:  Alert and oriented x 3,normal mood, behavior, speech, dress, and thought processes.   Lavern LITTIE Heck, MD

## 2024-05-04 ENCOUNTER — Ambulatory Visit
Admission: RE | Admit: 2024-05-04 | Discharge: 2024-05-04 | Disposition: A | Discharge status: Home / Self Care | Attending: Family Medicine | Admitting: Family Medicine

## 2024-05-04 ENCOUNTER — Ambulatory Visit: Payer: Self-pay

## 2024-05-04 VITALS — BP 128/81 | HR 77 | Temp 98.4°F | Resp 18 | Ht 63.0 in | Wt 200.0 lb

## 2024-05-04 DIAGNOSIS — J208 Acute bronchitis due to other specified organisms: Secondary | ICD-10-CM | POA: Diagnosis not present

## 2024-05-04 MED ORDER — PREDNISONE 20 MG PO TABS: 40.0000 mg | ORAL_TABLET | Freq: Every day | ORAL | 0 refills | Status: DC

## 2024-05-04 MED ORDER — BENZONATATE 200 MG PO CAPS: 200.0000 mg | ORAL_CAPSULE | Freq: Three times a day (TID) | ORAL | 0 refills | Status: DC | PRN

## 2024-05-04 MED ORDER — AZITHROMYCIN 250 MG PO TABS: ORAL_TABLET | ORAL | 0 refills | Status: DC

## 2024-05-04 NOTE — Telephone Encounter (Signed)
 Noted

## 2024-05-04 NOTE — ED Provider Notes (Signed)
 Dawn Roth C[facility]    CSN: [id number] Arrival date & time: 05/04/24  1031      History   Chief Complaint Chief Complaint  Patient presents with   Cough    Entered by patient    HPI Dawn Roth is a 43 y.o. female.   Patient states she has had a cough for over a week and feels like she is getting worse.  She feels like she may have bronchitis.  She does have some chest pain with deep breath and cough.  Is coughing up some sputum.  Is feeling tired.  Does not really have postnasal drip or sinus congestion.  No sore throat.  No fever or chills.  Negative COVID testing at home    Past Medical History:  Diagnosis Date   Heart murmur    MITRAL VALUVE REGURITATION   Hypertension    chronic HTN, on meds    Patient Active Problem List   Diagnosis Date Noted   Excessive bleeding in premenopausal period 10/22/2023   Spouse of alcoholic husband 87/93/7976   Obesity (BMI 30-39.9) 01/08/2021   Adjustment disorder with anxiety 07/26/2020   Dermatofibroma 05/13/2016   Oral contraceptive use 11/15/2015   Essential hypertension 06/09/2013   Mitral valve regurgitation 04/28/2013    Past Surgical History:  Procedure Laterality Date   CESAREAN SECTION N/A 07/17/2015   Procedure: CESAREAN SECTION;  Surgeon: Alm Cook, MD;  Location: [facility];  Service: Obstetrics;  Laterality: N/A;  Primary edc 07/14/15 nkda   CESAREAN SECTION WITH BILATERAL TUBAL LIGATION Bilateral 11/17/2017   Procedure: CESAREAN SECTION WITH BILATERAL TUBAL LIGATION;  Surgeon: Latisha Medford, MD;  Location: [facility];  Service: Obstetrics;  Laterality: Bilateral;  Repeat edc 11/27/17 NKDA Tracey RNFA   CHOLECYSTECTOMY  09/2011    OB History     Gravida  4   Para  2   Term  2   Preterm      AB  2   Living  2      SAB  2   IAB      Ectopic      Multiple  0   Live Births  2            Home Medications    Prior to Admission medications   Medication Sig Start Date End  Date Taking? Authorizing Provider  ALPRAZolam  (XANAX ) 0.5 MG tablet Take 1 tablet (0.5 mg total) by mouth daily as needed for anxiety (or panic). 03/20/24  Yes Jodie Lavern CROME, MD  azithromycin  (ZITHROMAX  Z-PAK) 250 MG tablet Take two pills today followed by one a day until gone 05/04/24  Yes Maranda Jamee Jacob, MD  benzonatate  (TESSALON ) 200 MG capsule Take 1 capsule (200 mg total) by mouth 3 (three) times daily as needed for cough. 05/04/24  Yes Maranda Jamee Jacob, MD  escitalopram  (LEXAPRO ) 20 MG tablet Take 1 tablet (20 mg total) by mouth daily. 03/20/24  Yes Jodie Lavern CROME, MD  labetalol  (NORMODYNE ) 200 MG tablet TAKE ONE TABLET BY MOUTH TWICE DAILY 12/02/23  Yes Jodie Lavern CROME, MD  Multiple Vitamins-Minerals (MULTIVITAMIN ADULT PO) multivitamin   Yes [provider]  Norethin-Eth Estrad-Fe Biphas (LO LOESTRIN FE PO) Take by mouth.   Yes [provider]  predniSONE  (DELTASONE ) 20 MG tablet Take 2 tablets (40 mg total) by mouth daily with breakfast. 05/04/24  Yes Maranda Jamee Jacob, MD    Family History Family History  Problem Relation Age of Onset   Heart attack  Father 42       passed sept 2022; MI   Hypertension Father    Alcohol abuse Father    Arthritis Father    Depression Sister    Anxiety disorder Sister    Healthy Son    Healthy Son    Heart disease Paternal Aunt    Liver cancer Maternal Grandmother    Learning disabilities Maternal Grandfather    Brain cancer Paternal Grandmother    Heart attack Paternal Grandfather     Social History Social History   Tobacco Use   Smoking status: Never   Smokeless tobacco: Never  Vaping Use   Vaping status: Never Used  Substance Use Topics   Alcohol use: Yes   Drug use: No     Allergies   Patient has no known allergies.   Review of Systems Review of Systems As see HPI  Physical Exam Triage Vital Signs ED Triage Vitals  Encounter Vitals Group     BP 05/04/24 1043 128/81     Girls Systolic BP Percentile  --      Girls Diastolic BP Percentile --      Boys Systolic BP Percentile --      Boys Diastolic BP Percentile --      Pulse Rate 05/04/24 1043 77     Resp 05/04/24 1043 18     Temp 05/04/24 1043 98.4 F (36.9 C)     Temp Source 05/04/24 1043 Oral     SpO2 05/04/24 1043 98 %     Weight 05/04/24 1045 200 lb (90.7 kg)     Height 05/04/24 1045 5' 3 (1.6 m)     Head Circumference --      Peak Flow --      Pain Score 05/04/24 1045 0     Pain Loc --      Pain Education --      Exclude from Growth Chart --    No data found.  Updated Vital Signs BP 128/81 (BP Location: Right Arm)   Pulse 77   Temp 98.4 F (36.9 C) (Oral)   Resp 18   Ht 5' 3 (1.6 m)   Wt 90.7 kg   SpO2 98%   BMI 35.43 kg/m      Physical Exam Constitutional:      General: She is not in acute distress.    Appearance: She is well-developed. She is ill-appearing.  HENT:     Head: Normocephalic and atraumatic.     Nose: No congestion or rhinorrhea.     Mouth/Throat:     Pharynx: No posterior oropharyngeal erythema.  Eyes:     Conjunctiva/sclera: Conjunctivae normal.     Pupils: Pupils are equal, round, and reactive to light.  Cardiovascular:     Rate and Rhythm: Normal rate and regular rhythm.     Heart sounds: Normal heart sounds.  Pulmonary:     Effort: Pulmonary effort is normal. No respiratory distress.     Breath sounds: Normal breath sounds. No wheezing or rhonchi.  Abdominal:     General: There is no distension.     Palpations: Abdomen is soft.  Musculoskeletal:        General: Normal range of motion.     Cervical back: Normal range of motion and neck supple.  Skin:    General: Skin is warm and dry.  Neurological:     Mental Status: She is alert.      UC Treatments / Results  Labs (all labs  ordered are listed, but only abnormal results are displayed) Labs Reviewed - No data to display  EKG   Radiology No results found.  Procedures Procedures (including critical care  time)  Medications Ordered in UC Medications - No data to display  Initial Impression / Assessment and Plan / UC Course  I have reviewed the triage vital signs and the nursing notes.  Pertinent labs & imaging results that were available during my care of the patient were reviewed by me and considered in my medical decision making (see chart for details).     Patient states she is having worsening symptoms after a week of coughing.  Will treat for bronchitis.  See PCP if fails to improve Final Clinical Impressions(s) / UC Diagnoses   Final diagnoses:  Acute bronchitis due to other specified organisms     Discharge Instructions      Take the Z-Pak antibiotic as directed.  That is 2 pills today, then 1 a day until gone Take prednisone  once a day for 5 days Take Tessalon  2-3 times a day to help with cough May take Delsym or Mucinex  DM in addition if needed Make sure you drink lots of fluids See your doctor if not improving by next week   ED Prescriptions     Medication Sig Dispense Auth. Provider   azithromycin  (ZITHROMAX  Z-PAK) 250 MG tablet Take two pills today followed by one a day until gone 6 tablet Maranda Jamee Jacob, MD   predniSONE  (DELTASONE ) 20 MG tablet Take 2 tablets (40 mg total) by mouth daily with breakfast. 10 tablet Maranda Jamee Jacob, MD   benzonatate  (TESSALON ) 200 MG capsule Take 1 capsule (200 mg total) by mouth 3 (three) times daily as needed for cough. 21 capsule Maranda Jamee Jacob, MD      PDMP not reviewed this encounter.   Maranda Jamee Jacob, MD 05/04/24 [id number]

## 2024-05-04 NOTE — Discharge Instructions (Signed)
 Take the Z-Pak antibiotic as directed.  That is 2 pills today, then 1 a day until gone Take prednisone  once a day for 5 days Take Tessalon  2-3 times a day to help with cough May take Delsym or Mucinex  DM in addition if needed Make sure you drink lots of fluids See your doctor if not improving by next week

## 2024-05-04 NOTE — ED Triage Notes (Signed)
 Patient c/o cough x 1 week, getting over a cold, afebrile.  Concern for bronchitis.  Patient has taken Mucinex  and Albuterol  Inhaler.

## 2024-05-04 NOTE — Telephone Encounter (Addendum)
 FYI Only or Action Required?: FYI only for provider.  Patient was last seen in primary care on 05/02/2024 by Dawn Lavern CROME, MD.  Called Nurse Triage reporting Cough.  Symptoms began a week ago.  Interventions attempted: OTC medications: cough medicine.  Symptoms are: unchanged.  Triage Disposition: home care  Patient/caregiver understands and will follow disposition?: no - pt wishes to be seen  - pt will go to UC.               Copied from CRM #[id number]. Topic: Clinical - Red Word Triage >> May 04, 2024  9:42 AM Suzen RAMAN wrote: Red Word that prompted transfer to Nurse Triage: Productive cough for 2 weeks sometimes with sputum has been taken mucinex  but cough hasn't resolved. Answer Assessment - Initial Assessment Questions 1. ONSET: When did the cough begin?      1.5 weeks - cough started this weeks 2. SEVERITY: How bad is the cough today?      moderate 3. SPUTUM: Describe the color of your sputum (e.g., none, dry cough; clear, white, yellow, green)     Whitish  4. HEMOPTYSIS: Are you coughing up any blood? If Yes, ask: How much? (e.g., flecks, streaks, tablespoons, etc.)     no 5. DIFFICULTY BREATHING: Are you having difficulty breathing? If Yes, ask: How bad is it? (e.g., mild, moderate, severe)      No - if after a coughing fit 6. FEVER: Do you have a fever? If Yes, ask: What is your temperature, how was it measured, and when did it start?     No - had a lgf here and there 7. CARDIAC HISTORY: Do you have any history of heart disease? (e.g., heart attack, congestive heart failure)       htn 8. LUNG HISTORY: Do you have any history of lung disease?  (e.g., pulmonary embolus, asthma, emphysema)     pneumonia  10. OTHER SYMPTOMS: Do you have any other symptoms? (e.g., runny nose, wheezing, chest pain)       Sinus pressure  Protocols used: Cough - Acute Productive-A-AH

## 2024-05-08 ENCOUNTER — Ambulatory Visit: Admitting: Family Medicine

## 2024-10-21 ENCOUNTER — Other Ambulatory Visit: Payer: Self-pay | Admitting: Medical Genetics

## 2024-10-26 ENCOUNTER — Telehealth: Admitting: Family Medicine

## 2024-10-26 VITALS — Ht 63.0 in | Wt 200.0 lb

## 2024-10-26 DIAGNOSIS — N924 Excessive bleeding in the premenopausal period: Secondary | ICD-10-CM

## 2024-10-26 DIAGNOSIS — F4322 Adjustment disorder with anxiety: Secondary | ICD-10-CM | POA: Diagnosis not present

## 2024-10-26 DIAGNOSIS — Z3041 Encounter for surveillance of contraceptive pills: Secondary | ICD-10-CM

## 2024-10-26 MED ORDER — ALPRAZOLAM 0.5 MG PO TABS: 0.5000 mg | ORAL_TABLET | Freq: Every day | ORAL | 0 refills | Status: AC | PRN

## 2024-10-26 NOTE — Patient Instructions (Signed)
 Please follow up as scheduled for your next visit with me: 10/27/2024   If you have any questions or concerns, please don't hesitate to send me a message via MyChart or call the office at [phone]. Thank you for visiting with us  today! It's our pleasure caring for you.    VISIT SUMMARY: During your visit, we discussed your recent increase in anxiety and stress, breakthrough bleeding while on birth control, and sleep disturbances that may be related to perimenopause. We reviewed your current medications and discussed potential adjustments to help manage your symptoms.  YOUR PLAN: -ADJUSTMENT DISORDER WITH ANXIETY: Adjustment disorder with anxiety is a condition where you experience excessive stress and anxiety in response to a significant life change. In your case, this is related to your husband's job change and being snowed in. We will continue your current medication, Lexapro  20 mg daily, and have refilled your Xanax  0.5 mg to use as needed. Additionally, I encourage you to plan activities for the weekend to maintain a routine and engage in social interactions. Stress management techniques such as meditation and practicing gratitude can also be helpful.  -PERIMENOPAUSAL SYMPTOMS: Perimenopausal symptoms occur as you approach menopause and can include hormonal changes that lead to issues like breakthrough bleeding and sleep disturbances. We discussed the possibility of adjusting your birth control regimen with your OB/GYN to address the breakthrough bleeding. You may also consider taking a magnesium glycinate supplement to help with sleep. I provided information on hormone support options that may be beneficial.  INSTRUCTIONS: Please follow up with your OB/GYN to discuss adjusting your birth control regimen. Continue taking Lexapro  20 mg daily and use Xanax  0.5 mg as needed for anxiety. Consider incorporating stress management techniques such as meditation and gratitude practices into your routine.  If sleep disturbances persist, try taking a magnesium glycinate supplement. Maintain a routine and engage in social interactions to help manage stress.    Contains text generated by [employer].

## 2024-10-26 NOTE — Progress Notes (Signed)
 "  Subjective  CC:  Chief Complaint  Patient presents with   Adjustment disorder with anxiety   Virtual Visit via Video Note I connected with Alan Sink on 10/26/24 at  8:30 AM EST by a video enabled telemedicine application and verified that I am speaking with the correct person using two identifiers. Location patient: Home Location provider: [facility] Persons participating in the virtual visit: [place] Gustafson, Lavern LITTIE Heck, MD Avelina Berber, CMA  I discussed the limitations of evaluation and management by telemedicine and the availability of in person appointments. The patient expressed understanding and agreed to proceed.   HPI: Dawn Roth is a 44 y.o. female who presents to the office today to address the problems listed above in the chief complaint. Discussed the use of AI scribe software for clinical note transcription with the patient, who gave verbal consent to proceed. History of Present Illness Dawn Roth is a 44 year old female who presents with anxiety and breakthrough bleeding while on birth control.  Anxiety and mood disturbance, GAD on lexapro  20 with rare xanax  use (30 prescribed June 2025 and still has 2 pills) - Increased anxiety and stress over the past couple of weeks - Symptoms attributed to recent life changes, including husband's decision to leave his job after 20 years - Feelings of being down and overwhelmed, exacerbated by being snowed in and unable to leave the house - Current treatment with Lexapro  for anxiety, previously effective but less so recently - History of Xanax  use for anxiety, found helpful, but currently out of medication - Increased childcare responsibilities and being off work due to snow contributing to stress - Has chronic mild insomnia that is stable.  No significant changes in energy level, diet, denies remaining depressed.  No anxiety or panic symptoms.  Breakthrough uterine bleeding -  Breakthrough bleeding while on birth control pills for the past couple of months - Last month experienced unexpected bleeding halfway through the cycle, which is unusual for her - On biphasic Loestrin per GYN. Sleep disturbance and vasomotor symptoms - Sleep disturbances, including waking up due to sweating followed by feeling cold - Sleep issues are relatively new - Suspects symptoms may be related to hormonal changes as she approaches perimenopause - Considering supplements to help with sleep   Assessment  1. Adjustment disorder with anxiety   2. Oral contraceptive use   3. Abnormal vaginal bleeding in premenopausal patient      Plan  Assessment and Plan Assessment & Plan Adjustment disorder with anxiety Experiencing increased stress and anxiety due to recent life changes, including her husband's job change and weather-related disruptions. Reports feeling overwhelmed and down but is managing with current medication regimen. Lexapro  has been effective, and she has been using Xanax  sparingly. - No red flag symptoms kind of increase stressors but overall she is coping well.  Continue Lexapro  20 mg oral daily. - Refilled Xanax  0.5 mg oral as needed for anxiety.  Rarely uses. - Encouraged planning for the weekend to maintain routine and social interaction. - Advised on stress management techniques such as meditation and gratitude practices.  Perimenopausal symptoms Experiencing breakthrough bleeding and sleep disturbances, likely related to perimenopausal hormonal changes. Reports excessive sweating and feeling cold upon waking, which may be contributing to sleep issues. Current birth control regimen may not be optimal for perimenopausal symptoms. - Discuss with OB/GYN about adjusting birth control regimen to address breakthrough bleeding. - Consider magnesium glycinate supplement to aid sleep. -  Provided information on hormone support options.    Follow up: for cpe No orders of the  defined types were placed in this encounter.  Meds ordered this encounter  Medications   ALPRAZolam  (XANAX ) 0.5 MG tablet    Sig: Take 1 tablet (0.5 mg total) by mouth daily as needed for anxiety (or panic).    Dispense:  30 tablet    Refill:  0     I reviewed the patients updated PMH, FH, and SocHx.  Patient Active Problem List   Diagnosis Date Noted   Excessive bleeding in premenopausal period 10/22/2023   Spouse of alcoholic husband 87/93/7976   Obesity (BMI 30-39.9) 01/08/2021   Adjustment disorder with anxiety 07/26/2020   Dermatofibroma 05/13/2016   Oral contraceptive use 11/15/2015   Essential hypertension 06/09/2013   Mitral valve regurgitation 04/28/2013   Active Medications[1] Allergies: Patient has no known allergies. Family History: Patient family history includes Alcohol abuse in her father; Anxiety disorder in her sister; Arthritis in her father; Brain cancer in her paternal grandmother; Depression in her sister; Healthy in her son and son; Heart attack in her paternal grandfather; Heart attack (age of onset: 5) in her father; Heart disease in her paternal aunt; Hypertension in her father; Learning disabilities in her maternal grandfather; Liver cancer in her maternal grandmother. Social History:  Patient  reports that she has never smoked. She has never used smokeless tobacco. She reports current alcohol use. She reports that she does not use drugs.  Review of Systems: Constitutional: Negative for fever malaise or anorexia Cardiovascular: negative for chest pain Respiratory: negative for SOB or persistent cough Gastrointestinal: negative for abdominal pain  Objective  Vitals: Ht 5' 3 (1.6 m)   Wt 200 lb (90.7 kg)   BMI 35.43 kg/m  General: no acute distress , A&Ox3  Commons side effects, risks, benefits, and alternatives for medications and treatment plan prescribed today were discussed, and the patient expressed understanding of the given instructions.  Patient is instructed to call or message via MyChart if he/she has any questions or concerns regarding our treatment plan. No barriers to understanding were identified. We discussed Red Flag symptoms and signs in detail. Patient expressed understanding regarding what to do in case of urgent or emergency type symptoms.  Medication list was reconciled, printed and provided to the patient in AVS. Patient instructions and summary information was reviewed with the patient as documented in the AVS. This note was prepared with assistance of Dragon voice recognition software. Occasional wrong-word or sound-a-like substitutions may have occurred due to the inherent limitations of voice recognition software    [1]  Current Meds  Medication Sig   escitalopram  (LEXAPRO ) 20 MG tablet Take 1 tablet (20 mg total) by mouth daily.   labetalol  (NORMODYNE ) 200 MG tablet TAKE ONE TABLET BY MOUTH TWICE DAILY   Multiple Vitamins-Minerals (MULTIVITAMIN ADULT PO) multivitamin   Norethin-Eth Estrad-Fe Biphas (LO LOESTRIN FE PO) Take by mouth.   [DISCONTINUED] ALPRAZolam  (XANAX ) 0.5 MG tablet Take 1 tablet (0.5 mg total) by mouth daily as needed for anxiety (or panic).   "

## 2024-10-27 ENCOUNTER — Encounter: Payer: 59 | Admitting: Family Medicine

## 2024-11-02 ENCOUNTER — Encounter: Admitting: Family Medicine

## 2025-01-16 ENCOUNTER — Encounter: Admitting: Family Medicine
